# Patient Record
Sex: Female | Born: 1980 | Race: White | Hispanic: No | Marital: Single | Smoking: Never smoker
Health system: Southern US, Community
[De-identification: ages and names within clinical notes are randomized; demographics above are authoritative.]

## PROBLEM LIST (undated history)

## (undated) HISTORY — PX: INNER EAR SURGERY: SHX679

---

## 2016-01-14 ENCOUNTER — Inpatient Hospital Stay (HOSPITAL_COMMUNITY): Payer: BLUE CROSS/BLUE SHIELD

## 2016-01-14 ENCOUNTER — Inpatient Hospital Stay (HOSPITAL_COMMUNITY)
Admission: EM | Admit: 2016-01-14 | Discharge: 2016-01-21 | DRG: 235 | Disposition: A | Discharge status: Home / Self Care | Payer: BLUE CROSS/BLUE SHIELD | Source: Other Acute Inpatient Hospital | Attending: Thoracic Surgery (Cardiothoracic Vascular Surgery) | Admitting: Thoracic Surgery (Cardiothoracic Vascular Surgery)

## 2016-01-14 ENCOUNTER — Inpatient Hospital Stay (HOSPITAL_COMMUNITY): Payer: BLUE CROSS/BLUE SHIELD | Admitting: Certified Registered"

## 2016-01-14 ENCOUNTER — Emergency Department: Payer: BLUE CROSS/BLUE SHIELD

## 2016-01-14 ENCOUNTER — Emergency Department
Admission: EM | Admit: 2016-01-14 | Discharge: 2016-01-14 | Disposition: A | Discharge status: Nursing Home (Includes ICF) | Payer: BLUE CROSS/BLUE SHIELD | Attending: Emergency Medicine | Admitting: Emergency Medicine

## 2016-01-14 ENCOUNTER — Encounter (HOSPITAL_COMMUNITY)
Admission: EM | Disposition: A | Discharge status: Home / Self Care | Payer: BLUE CROSS/BLUE SHIELD | Source: Other Acute Inpatient Hospital | Attending: Thoracic Surgery (Cardiothoracic Vascular Surgery)

## 2016-01-14 ENCOUNTER — Encounter: Payer: Self-pay | Admitting: Emergency Medicine

## 2016-01-14 ENCOUNTER — Encounter
Admission: EM | Disposition: A | Discharge status: Nursing Home (Includes ICF) | Payer: Self-pay | Source: Home / Self Care | Attending: Emergency Medicine

## 2016-01-14 DIAGNOSIS — M79601 Pain in right arm: Secondary | ICD-10-CM | POA: Insufficient documentation

## 2016-01-14 DIAGNOSIS — J90 Pleural effusion, not elsewhere classified: Secondary | ICD-10-CM | POA: Diagnosis present

## 2016-01-14 DIAGNOSIS — I2542 Coronary artery dissection: Principal | ICD-10-CM | POA: Diagnosis present

## 2016-01-14 DIAGNOSIS — I25110 Atherosclerotic heart disease of native coronary artery with unstable angina pectoris: Secondary | ICD-10-CM

## 2016-01-14 DIAGNOSIS — I214 Non-ST elevation (NSTEMI) myocardial infarction: Secondary | ICD-10-CM

## 2016-01-14 DIAGNOSIS — E8770 Fluid overload, unspecified: Secondary | ICD-10-CM | POA: Diagnosis not present

## 2016-01-14 DIAGNOSIS — D696 Thrombocytopenia, unspecified: Secondary | ICD-10-CM | POA: Diagnosis not present

## 2016-01-14 DIAGNOSIS — R200 Anesthesia of skin: Secondary | ICD-10-CM | POA: Insufficient documentation

## 2016-01-14 DIAGNOSIS — Z3202 Encounter for pregnancy test, result negative: Secondary | ICD-10-CM | POA: Diagnosis not present

## 2016-01-14 DIAGNOSIS — J939 Pneumothorax, unspecified: Secondary | ICD-10-CM | POA: Diagnosis not present

## 2016-01-14 DIAGNOSIS — R079 Chest pain, unspecified: Secondary | ICD-10-CM | POA: Diagnosis present

## 2016-01-14 DIAGNOSIS — Z4682 Encounter for fitting and adjustment of non-vascular catheter: Secondary | ICD-10-CM

## 2016-01-14 DIAGNOSIS — Z951 Presence of aortocoronary bypass graft: Secondary | ICD-10-CM

## 2016-01-14 DIAGNOSIS — D62 Acute posthemorrhagic anemia: Secondary | ICD-10-CM | POA: Diagnosis not present

## 2016-01-14 DIAGNOSIS — I2109 ST elevation (STEMI) myocardial infarction involving other coronary artery of anterior wall: Secondary | ICD-10-CM | POA: Diagnosis present

## 2016-01-14 DIAGNOSIS — I2510 Atherosclerotic heart disease of native coronary artery without angina pectoris: Secondary | ICD-10-CM

## 2016-01-14 DIAGNOSIS — M79602 Pain in left arm: Secondary | ICD-10-CM | POA: Insufficient documentation

## 2016-01-14 HISTORY — PX: CARDIAC CATHETERIZATION: SHX172

## 2016-01-14 HISTORY — PX: CORONARY ARTERY BYPASS GRAFT: SHX141

## 2016-01-14 LAB — POCT I-STAT 3, ART BLOOD GAS (G3+)
ACID-BASE EXCESS: 3 mmol/L — AB (ref 0.0–2.0)
Bicarbonate: 27 mEq/L — ABNORMAL HIGH (ref 20.0–24.0)
O2 SAT: 100 %
PCO2 ART: 38.2 mmHg (ref 35.0–45.0)
PH ART: 7.455 — AB (ref 7.350–7.450)
TCO2: 28 mmol/L (ref 0–100)
pO2, Arterial: 480 mmHg — ABNORMAL HIGH (ref 80.0–100.0)

## 2016-01-14 LAB — POCT I-STAT, CHEM 8
BUN: 6 mg/dL (ref 6–20)
CALCIUM ION: 1.08 mmol/L — AB (ref 1.12–1.23)
Chloride: 104 mmol/L (ref 101–111)
Creatinine, Ser: 0.4 mg/dL — ABNORMAL LOW (ref 0.44–1.00)
Glucose, Bld: 116 mg/dL — ABNORMAL HIGH (ref 65–99)
HCT: 26 % — ABNORMAL LOW (ref 36.0–46.0)
HEMOGLOBIN: 8.8 g/dL — AB (ref 12.0–15.0)
Potassium: 3.5 mmol/L (ref 3.5–5.1)
SODIUM: 141 mmol/L (ref 135–145)
TCO2: 22 mmol/L (ref 0–100)

## 2016-01-14 LAB — CBC
HCT: 41.8 % (ref 35.0–47.0)
HCT: 27.1 % — ABNORMAL LOW (ref 36.0–46.0)
Hemoglobin: 13.6 g/dL (ref 12.0–16.0)
Hemoglobin: 9.1 g/dL — ABNORMAL LOW (ref 12.0–15.0)
MCH: 29.3 pg (ref 26.0–34.0)
MCH: 30.5 pg (ref 26.0–34.0)
MCHC: 32.5 g/dL (ref 32.0–36.0)
MCHC: 33.6 g/dL (ref 30.0–36.0)
MCV: 90.1 fL (ref 80.0–100.0)
MCV: 90.9 fL (ref 78.0–100.0)
PLATELETS: 166 10*3/uL (ref 150–440)
PLATELETS: 93 10*3/uL — AB (ref 150–400)
RBC: 4.64 MIL/uL (ref 3.80–5.20)
RBC: 2.98 MIL/uL — AB (ref 3.87–5.11)
RDW: 13.2 % (ref 11.5–14.5)
RDW: 13.1 % (ref 11.5–15.5)
WBC: 11.4 10*3/uL — AB (ref 3.6–11.0)
WBC: 18.5 10*3/uL — ABNORMAL HIGH (ref 4.0–10.5)

## 2016-01-14 LAB — HEMOGLOBIN AND HEMATOCRIT, BLOOD
HCT: 18.6 % — ABNORMAL LOW (ref 36.0–46.0)
Hemoglobin: 6.2 g/dL — CL (ref 12.0–15.0)

## 2016-01-14 LAB — BASIC METABOLIC PANEL
Anion gap: 6 (ref 5–15)
BUN: 14 mg/dL (ref 6–20)
CALCIUM: 9 mg/dL (ref 8.9–10.3)
CHLORIDE: 109 mmol/L (ref 101–111)
CO2: 24 mmol/L (ref 22–32)
CREATININE: 0.78 mg/dL (ref 0.44–1.00)
GFR calc non Af Amer: >60 mL/min (ref >60)
Glucose, Bld: 161 mg/dL — ABNORMAL HIGH (ref 65–99)
Potassium: 3.9 mmol/L (ref 3.5–5.1)
SODIUM: 139 mmol/L (ref 135–145)

## 2016-01-14 LAB — TROPONIN I
TROPONIN I: 1.13 ng/mL — AB (ref <0.031)
TROPONIN I: 8.03 ng/mL — AB (ref <0.031)

## 2016-01-14 LAB — PLATELET COUNT: Platelets: 112 10*3/uL — ABNORMAL LOW (ref 150–400)

## 2016-01-14 LAB — PROTIME-INR
INR: 1.8 — ABNORMAL HIGH (ref 0.00–1.49)
Prothrombin Time: 20.8 seconds — ABNORMAL HIGH (ref 11.6–15.2)

## 2016-01-14 LAB — ABO/RH: ABO/RH(D): O POS

## 2016-01-14 LAB — APTT: aPTT: 36 seconds (ref 24–37)

## 2016-01-14 SURGERY — LEFT HEART CATH AND CORONARY ANGIOGRAPHY
Anesthesia: Moderate Sedation

## 2016-01-14 SURGERY — CORONARY ARTERY BYPASS GRAFTING (CABG)
Anesthesia: General | Site: Chest | Laterality: Left

## 2016-01-14 MED ORDER — MORPHINE SULFATE (PF) 2 MG/ML IV SOLN
2.0000 mg | INTRAVENOUS | Status: DC | PRN
  Administered 2016-01-15: 2 mg via INTRAVENOUS
  Filled 2016-01-14: qty 1

## 2016-01-14 MED ORDER — SUCCINYLCHOLINE CHLORIDE 20 MG/ML IJ SOLN
INTRAMUSCULAR | Status: DC | PRN
  Administered 2016-01-14: 50 mg via INTRAVENOUS

## 2016-01-14 MED ORDER — PROTAMINE SULFATE 10 MG/ML IV SOLN
INTRAVENOUS | Status: DC | PRN
  Administered 2016-01-14: 22 mg via INTRAVENOUS

## 2016-01-14 MED ORDER — PANTOPRAZOLE SODIUM 40 MG PO TBEC
40.0000 mg | DELAYED_RELEASE_TABLET | Freq: Every day | ORAL | Status: DC
  Administered 2016-01-16 – 2016-01-21 (×6): 40 mg via ORAL
  Filled 2016-01-14 (×7): qty 1

## 2016-01-14 MED ORDER — HEPARIN (PORCINE) IN NACL 2-0.9 UNIT/ML-% IJ SOLN
INTRAMUSCULAR | Status: AC
  Filled 2016-01-14: qty 500

## 2016-01-14 MED ORDER — MORPHINE SULFATE (PF) 2 MG/ML IV SOLN
1.0000 mg | INTRAVENOUS | Status: AC | PRN
  Administered 2016-01-14 – 2016-01-15 (×3): 4 mg via INTRAVENOUS
  Administered 2016-01-15: 2 mg via INTRAVENOUS
  Filled 2016-01-14 (×3): qty 2
  Filled 2016-01-14: qty 1
  Filled 2016-01-14: qty 2

## 2016-01-14 MED ORDER — PHENYLEPHRINE HCL 10 MG/ML IJ SOLN
INTRAMUSCULAR | Status: DC | PRN
  Administered 2016-01-14: 120 ug via INTRAVENOUS

## 2016-01-14 MED ORDER — FENTANYL CITRATE (PF) 100 MCG/2ML IJ SOLN
INTRAMUSCULAR | Status: DC | PRN
  Administered 2016-01-14 (×2): 25 ug via INTRAVENOUS

## 2016-01-14 MED ORDER — SODIUM CHLORIDE 0.9 % IV SOLN
10.0000 g | INTRAVENOUS | Status: DC | PRN
  Administered 2016-01-14: 5 g/h via INTRAVENOUS

## 2016-01-14 MED ORDER — ACETAMINOPHEN 650 MG RE SUPP: 650.0000 mg | Freq: Once | RECTAL | Status: AC

## 2016-01-14 MED ORDER — DEXMEDETOMIDINE HCL IN NACL 400 MCG/100ML IV SOLN
0.1000 ug/kg/h | INTRAVENOUS | Status: DC
Start: 2016-01-14 — End: 2016-01-14
  Filled 2016-01-14: qty 100

## 2016-01-14 MED ORDER — HEMOSTATIC AGENTS (NO CHARGE) OPTIME
TOPICAL | Status: DC | PRN
  Administered 2016-01-14: 1 via TOPICAL

## 2016-01-14 MED ORDER — VERAPAMIL HCL 2.5 MG/ML IV SOLN
INTRAVENOUS | Status: AC
  Filled 2016-01-14: qty 2

## 2016-01-14 MED ORDER — SODIUM CHLORIDE 0.9 % IJ SOLN: 3.0000 mL | INTRAMUSCULAR | Status: DC | PRN

## 2016-01-14 MED ORDER — SUCCINYLCHOLINE CHLORIDE 20 MG/ML IJ SOLN
INTRAMUSCULAR | Status: AC
  Filled 2016-01-14: qty 1

## 2016-01-14 MED ORDER — BISACODYL 5 MG PO TBEC
10.0000 mg | DELAYED_RELEASE_TABLET | Freq: Every day | ORAL | Status: DC
  Administered 2016-01-15 – 2016-01-17 (×3): 10 mg via ORAL
  Filled 2016-01-14 (×3): qty 2

## 2016-01-14 MED ORDER — ATROPINE SULFATE 0.1 MG/ML IJ SOLN
INTRAMUSCULAR | Status: AC
  Filled 2016-01-14: qty 10

## 2016-01-14 MED ORDER — ACETAMINOPHEN 160 MG/5ML PO SOLN
650.0000 mg | Freq: Once | ORAL | Status: AC
  Administered 2016-01-15: 650 mg

## 2016-01-14 MED ORDER — FENTANYL CITRATE (PF) 250 MCG/5ML IJ SOLN
INTRAMUSCULAR | Status: AC
  Filled 2016-01-14: qty 25

## 2016-01-14 MED ORDER — NITROGLYCERIN 5 MG/ML IV SOLN
INTRAVENOUS | Status: AC
  Filled 2016-01-14: qty 10

## 2016-01-14 MED ORDER — HEPARIN SODIUM (PORCINE) 1000 UNIT/ML IJ SOLN
INTRAMUSCULAR | Status: DC | PRN
  Administered 2016-01-14: 3000 [IU] via INTRAVENOUS

## 2016-01-14 MED ORDER — LACTATED RINGERS IV SOLN: 500.0000 mL | Freq: Once | INTRAVENOUS | Status: DC | PRN

## 2016-01-14 MED ORDER — LEVOFLOXACIN IN D5W 500 MG/100ML IV SOLN
INTRAVENOUS | Status: DC | PRN
  Administered 2016-01-14: 500 mg via INTRAVENOUS

## 2016-01-14 MED ORDER — HEPARIN SODIUM (PORCINE) 1000 UNIT/ML IJ SOLN
INTRAMUSCULAR | Status: AC
  Filled 2016-01-14: qty 1

## 2016-01-14 MED ORDER — MIDAZOLAM HCL 2 MG/2ML IJ SOLN
2.0000 mg | INTRAMUSCULAR | Status: DC | PRN
Start: 2016-01-14 — End: 2016-01-17
  Administered 2016-01-15 (×2): 2 mg via INTRAVENOUS
  Filled 2016-01-14 (×3): qty 2

## 2016-01-14 MED ORDER — ASPIRIN EC 325 MG PO TBEC
325.0000 mg | DELAYED_RELEASE_TABLET | Freq: Every day | ORAL | Status: DC
  Administered 2016-01-15 – 2016-01-21 (×7): 325 mg via ORAL
  Filled 2016-01-14 (×7): qty 1

## 2016-01-14 MED ORDER — EPINEPHRINE HCL 1 MG/ML IJ SOLN
0.0000 ug/min | INTRAVENOUS | Status: DC
  Filled 2016-01-14 (×2): qty 4

## 2016-01-14 MED ORDER — DOPAMINE-DEXTROSE 3.2-5 MG/ML-% IV SOLN
0.0000 ug/kg/min | INTRAVENOUS | Status: DC
  Filled 2016-01-14: qty 250

## 2016-01-14 MED ORDER — LACTATED RINGERS IV SOLN
INTRAVENOUS | Status: DC
  Administered 2016-01-14: via INTRAVENOUS

## 2016-01-14 MED ORDER — SODIUM CHLORIDE 0.9 % IV SOLN: INTRAVENOUS | Status: DC

## 2016-01-14 MED ORDER — SODIUM CHLORIDE 0.9 % IV SOLN
0.5000 g/h | INTRAVENOUS | Status: DC
  Filled 2016-01-14: qty 20

## 2016-01-14 MED ORDER — PROPOFOL 10 MG/ML IV BOLUS
INTRAVENOUS | Status: AC
  Filled 2016-01-14: qty 20

## 2016-01-14 MED ORDER — TRAMADOL HCL 50 MG PO TABS
50.0000 mg | ORAL_TABLET | ORAL | Status: DC | PRN
  Administered 2016-01-15 – 2016-01-18 (×7): 100 mg via ORAL
  Administered 2016-01-19 – 2016-01-21 (×4): 50 mg via ORAL
  Filled 2016-01-14 (×5): qty 2
  Filled 2016-01-14 (×4): qty 1
  Filled 2016-01-14 (×3): qty 2

## 2016-01-14 MED ORDER — MIDAZOLAM HCL 10 MG/2ML IJ SOLN
INTRAMUSCULAR | Status: AC
  Filled 2016-01-14: qty 2

## 2016-01-14 MED ORDER — VERAPAMIL HCL 2.5 MG/ML IV SOLN
INTRAVENOUS | Status: DC | PRN
  Administered 2016-01-14: 2.5 mg via INTRA_ARTERIAL

## 2016-01-14 MED ORDER — MAGNESIUM SULFATE 4 GM/100ML IV SOLN
4.0000 g | Freq: Once | INTRAVENOUS | Status: AC
  Administered 2016-01-14: 4 g via INTRAVENOUS
  Filled 2016-01-14: qty 100

## 2016-01-14 MED ORDER — PROTAMINE SULFATE 10 MG/ML IV SOLN
INTRAVENOUS | Status: AC
  Filled 2016-01-14: qty 25

## 2016-01-14 MED ORDER — OXYCODONE HCL 5 MG PO TABS
5.0000 mg | ORAL_TABLET | ORAL | Status: DC | PRN
  Administered 2016-01-15 – 2016-01-17 (×5): 5 mg via ORAL
  Administered 2016-01-18 – 2016-01-19 (×3): 10 mg via ORAL
  Administered 2016-01-19 – 2016-01-20 (×3): 5 mg via ORAL
  Filled 2016-01-14 (×4): qty 1
  Filled 2016-01-14 (×3): qty 2
  Filled 2016-01-14 (×2): qty 1
  Filled 2016-01-14: qty 2
  Filled 2016-01-14: qty 1

## 2016-01-14 MED ORDER — FENTANYL CITRATE (PF) 100 MCG/2ML IJ SOLN
INTRAMUSCULAR | Status: DC | PRN
  Administered 2016-01-14: 250 ug via INTRAVENOUS
  Administered 2016-01-14: 100 ug via INTRAVENOUS
  Administered 2016-01-14: 250 ug via INTRAVENOUS
  Administered 2016-01-14: 100 ug via INTRAVENOUS
  Administered 2016-01-14: 50 ug via INTRAVENOUS
  Administered 2016-01-14: 250 ug via INTRAVENOUS

## 2016-01-14 MED ORDER — DEXMEDETOMIDINE HCL IN NACL 200 MCG/50ML IV SOLN
0.0000 ug/kg/h | INTRAVENOUS | Status: DC
  Filled 2016-01-14: qty 50

## 2016-01-14 MED ORDER — NITROGLYCERIN IN D5W 200-5 MCG/ML-% IV SOLN: 0.0000 ug/min | INTRAVENOUS | Status: DC

## 2016-01-14 MED ORDER — MIDAZOLAM HCL 2 MG/2ML IJ SOLN
INTRAMUSCULAR | Status: DC | PRN
  Administered 2016-01-14: 1 mg via INTRAVENOUS

## 2016-01-14 MED ORDER — PROPOFOL 10 MG/ML IV BOLUS
INTRAVENOUS | Status: DC | PRN
  Administered 2016-01-14: 60 mg via INTRAVENOUS

## 2016-01-14 MED ORDER — ACETAMINOPHEN 160 MG/5ML PO SOLN
1000.0000 mg | Freq: Four times a day (QID) | ORAL | Status: AC
  Administered 2016-01-15: 1000 mg

## 2016-01-14 MED ORDER — 0.9 % SODIUM CHLORIDE (POUR BTL) OPTIME
TOPICAL | Status: DC | PRN
  Administered 2016-01-14: 6000 mL

## 2016-01-14 MED ORDER — IOHEXOL 300 MG/ML  SOLN
INTRAMUSCULAR | Status: DC | PRN
  Administered 2016-01-14: 45 mL via INTRA_ARTERIAL

## 2016-01-14 MED ORDER — LEVOFLOXACIN IN D5W 750 MG/150ML IV SOLN
750.0000 mg | INTRAVENOUS | Status: AC
Start: 2016-01-15 — End: 2016-01-15
  Administered 2016-01-15: 750 mg via INTRAVENOUS
  Filled 2016-01-14: qty 150

## 2016-01-14 MED ORDER — LIDOCAINE HCL (CARDIAC) 20 MG/ML IV SOLN: INTRAVENOUS | Status: DC | PRN

## 2016-01-14 MED ORDER — NITROGLYCERIN 0.4 MG SL SUBL: 0.4000 mg | SUBLINGUAL_TABLET | SUBLINGUAL | Status: DC | PRN

## 2016-01-14 MED ORDER — SODIUM CHLORIDE 0.9 % IJ SOLN
OROMUCOSAL | Status: DC | PRN
  Administered 2016-01-14: 4 mL via TOPICAL

## 2016-01-14 MED ORDER — INSULIN REGULAR BOLUS VIA INFUSION
0.0000 [IU] | Freq: Three times a day (TID) | INTRAVENOUS | Status: DC
  Filled 2016-01-14: qty 10

## 2016-01-14 MED ORDER — ALBUMIN HUMAN 5 % IV SOLN
INTRAVENOUS | Status: DC | PRN
  Administered 2016-01-14: 21:00:00 via INTRAVENOUS

## 2016-01-14 MED ORDER — FENTANYL CITRATE (PF) 100 MCG/2ML IJ SOLN
INTRAMUSCULAR | Status: AC
  Filled 2016-01-14: qty 2

## 2016-01-14 MED ORDER — METOPROLOL TARTRATE 25 MG/10 ML ORAL SUSPENSION: 12.5000 mg | Freq: Two times a day (BID) | ORAL | Status: DC

## 2016-01-14 MED ORDER — MIDAZOLAM HCL 2 MG/2ML IJ SOLN
INTRAMUSCULAR | Status: AC
  Administered 2016-01-14: 2 mg
  Filled 2016-01-14: qty 2

## 2016-01-14 MED ORDER — METOPROLOL TARTRATE 12.5 MG HALF TABLET: 12.5000 mg | ORAL_TABLET | Freq: Two times a day (BID) | ORAL | Status: DC

## 2016-01-14 MED ORDER — HEPARIN SODIUM (PORCINE) 1000 UNIT/ML IJ SOLN
INTRAMUSCULAR | Status: DC | PRN
  Administered 2016-01-14: 12000 [IU] via INTRAVENOUS
  Administered 2016-01-14: 10000 [IU] via INTRAVENOUS

## 2016-01-14 MED ORDER — ONDANSETRON HCL 4 MG/2ML IJ SOLN
4.0000 mg | Freq: Four times a day (QID) | INTRAMUSCULAR | Status: DC | PRN
  Administered 2016-01-15 – 2016-01-16 (×3): 4 mg via INTRAVENOUS
  Filled 2016-01-14 (×3): qty 2

## 2016-01-14 MED ORDER — SODIUM CHLORIDE 0.9 % IV SOLN: 250.0000 mL | INTRAVENOUS | Status: DC

## 2016-01-14 MED ORDER — SODIUM CHLORIDE 0.9 % IV SOLN
INTRAVENOUS | Status: DC
  Filled 2016-01-14 (×2): qty 2.5

## 2016-01-14 MED ORDER — SODIUM CHLORIDE 0.9 % IV SOLN
INTRAVENOUS | Status: DC
  Filled 2016-01-14 (×2): qty 40

## 2016-01-14 MED ORDER — PHENYLEPHRINE HCL 10 MG/ML IJ SOLN
30.0000 ug/min | INTRAVENOUS | Status: DC
  Filled 2016-01-14 (×2): qty 2

## 2016-01-14 MED ORDER — ASPIRIN 81 MG PO CHEW: 324.0000 mg | CHEWABLE_TABLET | Freq: Every day | ORAL | Status: DC

## 2016-01-14 MED ORDER — ROCURONIUM BROMIDE 100 MG/10ML IV SOLN
INTRAVENOUS | Status: DC | PRN
  Administered 2016-01-14 (×3): 50 mg via INTRAVENOUS

## 2016-01-14 MED ORDER — VANCOMYCIN HCL 1000 MG IV SOLR
1250.0000 mg | INTRAVENOUS | Status: DC | PRN
  Administered 2016-01-14: 1250 mg via INTRAVENOUS

## 2016-01-14 MED ORDER — LACTATED RINGERS IV SOLN
INTRAVENOUS | Status: DC | PRN
  Administered 2016-01-14 (×2): via INTRAVENOUS

## 2016-01-14 MED ORDER — PLASMA-LYTE 148 IV SOLN
INTRAVENOUS | Status: DC | PRN
  Administered 2016-01-14: 500 mL via INTRAVASCULAR

## 2016-01-14 MED ORDER — PHENYLEPHRINE HCL 10 MG/ML IJ SOLN
0.0000 ug/min | INTRAMUSCULAR | Status: DC
  Administered 2016-01-15: 20 ug/min via INTRAVENOUS
  Filled 2016-01-14 (×2): qty 2

## 2016-01-14 MED ORDER — ATROPINE SULFATE 0.1 MG/ML IJ SOLN
INTRAMUSCULAR | Status: DC | PRN
  Administered 2016-01-14: 0.5 mg via INTRAVENOUS

## 2016-01-14 MED ORDER — SODIUM CHLORIDE 0.9 % IV SOLN
INTRAVENOUS | Status: DC
  Filled 2016-01-14 (×2): qty 30

## 2016-01-14 MED ORDER — VANCOMYCIN HCL IN DEXTROSE 1-5 GM/200ML-% IV SOLN
1000.0000 mg | Freq: Once | INTRAVENOUS | Status: AC
  Administered 2016-01-15: 1000 mg via INTRAVENOUS
  Filled 2016-01-14: qty 200

## 2016-01-14 MED ORDER — ASPIRIN 81 MG PO CHEW
324.0000 mg | CHEWABLE_TABLET | Freq: Once | ORAL | Status: AC
  Administered 2016-01-14: 324 mg via ORAL
  Filled 2016-01-14: qty 4

## 2016-01-14 MED ORDER — IOHEXOL 350 MG/ML SOLN
75.0000 mL | Freq: Once | INTRAVENOUS | Status: AC | PRN
  Administered 2016-01-14: 75 mL via INTRAVENOUS

## 2016-01-14 MED ORDER — POTASSIUM CHLORIDE 10 MEQ/50ML IV SOLN
10.0000 meq | INTRAVENOUS | Status: AC
  Administered 2016-01-14 – 2016-01-15 (×3): 10 meq via INTRAVENOUS

## 2016-01-14 MED ORDER — SODIUM CHLORIDE 0.9 % IJ SOLN
3.0000 mL | Freq: Two times a day (BID) | INTRAMUSCULAR | Status: DC
  Administered 2016-01-15: 3 mL via INTRAVENOUS

## 2016-01-14 MED ORDER — ROCURONIUM BROMIDE 50 MG/5ML IV SOLN
INTRAVENOUS | Status: AC
  Filled 2016-01-14: qty 1

## 2016-01-14 MED ORDER — SODIUM CHLORIDE 0.45 % IV SOLN: INTRAVENOUS | Status: DC | PRN

## 2016-01-14 MED ORDER — MIDAZOLAM HCL 5 MG/5ML IJ SOLN
INTRAMUSCULAR | Status: DC | PRN
  Administered 2016-01-14 (×2): 5 mg via INTRAVENOUS

## 2016-01-14 MED ORDER — NITROGLYCERIN IN D5W 200-5 MCG/ML-% IV SOLN
2.0000 ug/min | INTRAVENOUS | Status: DC
  Filled 2016-01-14: qty 250

## 2016-01-14 MED ORDER — LIDOCAINE HCL (CARDIAC) 20 MG/ML IV SOLN
INTRAVENOUS | Status: AC
  Filled 2016-01-14: qty 5

## 2016-01-14 MED ORDER — POTASSIUM CHLORIDE 2 MEQ/ML IV SOLN
80.0000 meq | INTRAVENOUS | Status: DC
  Filled 2016-01-14 (×2): qty 40

## 2016-01-14 MED ORDER — LEVOFLOXACIN IN D5W 500 MG/100ML IV SOLN
500.0000 mg | INTRAVENOUS | Status: DC
  Filled 2016-01-14 (×2): qty 100

## 2016-01-14 MED ORDER — PAPAVERINE HCL 30 MG/ML IJ SOLN
INTRAMUSCULAR | Status: DC
  Filled 2016-01-14 (×2): qty 2.5

## 2016-01-14 MED ORDER — SODIUM CHLORIDE 0.9 % IV SOLN
200.0000 ug | INTRAVENOUS | Status: DC | PRN
  Administered 2016-01-14: .7 ug/kg/h via INTRAVENOUS

## 2016-01-14 MED ORDER — MAGNESIUM SULFATE 50 % IJ SOLN
40.0000 meq | INTRAMUSCULAR | Status: DC
  Filled 2016-01-14 (×2): qty 10

## 2016-01-14 MED ORDER — ACETAMINOPHEN 500 MG PO TABS
1000.0000 mg | ORAL_TABLET | Freq: Four times a day (QID) | ORAL | Status: AC
  Administered 2016-01-15 – 2016-01-19 (×12): 1000 mg via ORAL
  Filled 2016-01-14 (×11): qty 2

## 2016-01-14 MED ORDER — LACTATED RINGERS IV SOLN
INTRAVENOUS | Status: DC
  Administered 2016-01-14 – 2016-01-15 (×2): via INTRAVENOUS

## 2016-01-14 MED ORDER — MIDAZOLAM HCL 2 MG/2ML IJ SOLN
INTRAMUSCULAR | Status: AC
  Filled 2016-01-14: qty 2

## 2016-01-14 MED ORDER — SIMVASTATIN 20 MG PO TABS
20.0000 mg | ORAL_TABLET | Freq: Every day | ORAL | Status: DC
  Administered 2016-01-15 – 2016-01-17 (×3): 20 mg via ORAL
  Filled 2016-01-14 (×3): qty 1

## 2016-01-14 MED ORDER — ONDANSETRON HCL 4 MG/2ML IJ SOLN
INTRAMUSCULAR | Status: AC
  Filled 2016-01-14: qty 2

## 2016-01-14 MED ORDER — CHLORHEXIDINE GLUCONATE 0.12 % MT SOLN
15.0000 mL | OROMUCOSAL | Status: AC
  Administered 2016-01-15: 15 mL via OROMUCOSAL

## 2016-01-14 MED ORDER — VANCOMYCIN HCL 10 G IV SOLR
1250.0000 mg | INTRAVENOUS | Status: DC
  Filled 2016-01-14 (×2): qty 1250

## 2016-01-14 MED ORDER — BISACODYL 10 MG RE SUPP: 10.0000 mg | Freq: Every day | RECTAL | Status: DC

## 2016-01-14 MED ORDER — PHENYLEPHRINE HCL 10 MG/ML IJ SOLN
10.0000 mg | INTRAVENOUS | Status: DC | PRN
  Administered 2016-01-14: 15 ug/min via INTRAVENOUS

## 2016-01-14 MED ORDER — SODIUM CHLORIDE 0.9 % IV SOLN
INTRAVENOUS | Status: DC | PRN
  Administered 2016-01-14: 22:00:00 via INTRAVENOUS

## 2016-01-14 MED ORDER — FAMOTIDINE IN NACL 20-0.9 MG/50ML-% IV SOLN
20.0000 mg | Freq: Two times a day (BID) | INTRAVENOUS | Status: AC
  Administered 2016-01-14 – 2016-01-15 (×2): 20 mg via INTRAVENOUS
  Filled 2016-01-14: qty 50

## 2016-01-14 MED ORDER — DOCUSATE SODIUM 100 MG PO CAPS
200.0000 mg | ORAL_CAPSULE | Freq: Every day | ORAL | Status: DC
  Administered 2016-01-15 – 2016-01-21 (×5): 200 mg via ORAL
  Filled 2016-01-14 (×6): qty 2

## 2016-01-14 MED ORDER — METOPROLOL TARTRATE 1 MG/ML IV SOLN: 2.5000 mg | INTRAVENOUS | Status: DC | PRN

## 2016-01-14 MED ORDER — ALBUMIN HUMAN 5 % IV SOLN
250.0000 mL | INTRAVENOUS | Status: AC | PRN
  Administered 2016-01-14 – 2016-01-15 (×4): 250 mL via INTRAVENOUS
  Filled 2016-01-14: qty 250

## 2016-01-14 MED ORDER — INSULIN REGULAR HUMAN 100 UNIT/ML IJ SOLN
250.0000 [IU] | INTRAMUSCULAR | Status: DC | PRN
  Administered 2016-01-14: 3 [IU]/h via INTRAVENOUS

## 2016-01-14 SURGICAL SUPPLY — 15 items
CABLE ADAPT CONN TEMP 6FT (ADAPTER) IMPLANT
CATH IAB 7FR 40ML (CATHETERS) ×3 IMPLANT
CATH OPTITORQUE JACKY 4.0 5F (CATHETERS) ×3 IMPLANT
DEVICE INFLAT 30 PLUS (MISCELLANEOUS) IMPLANT
DEVICE RAD TR BAND REGULAR (VASCULAR PRODUCTS) ×3 IMPLANT
DEVICE SAFEGUARD 24CM (GAUZE/BANDAGES/DRESSINGS) IMPLANT
DEVICE SECURE STATLOCK IABP (MISCELLANEOUS) ×6 IMPLANT
GLIDESHEATH SLEND SS 6F .021 (SHEATH) ×3 IMPLANT
KIT MANI 3VAL PERCEP (MISCELLANEOUS) ×3 IMPLANT
PACK CARDIAC CATH (CUSTOM PROCEDURE TRAY) ×3 IMPLANT
SHEATH PINNACLE 7F 10CM (SHEATH) ×3 IMPLANT
SLEEVE REPOSITIONING LENGTH 30 (MISCELLANEOUS) IMPLANT
WIRE HITORQ VERSACORE ST 145CM (WIRE) ×3 IMPLANT
WIRE PACING TEMP ST TIP 5 (CATHETERS) IMPLANT
WIRE SAFE-T 1.5MM-J .035X260CM (WIRE) IMPLANT

## 2016-01-14 SURGICAL SUPPLY — 82 items
BAG DECANTER FOR FLEXI CONT (MISCELLANEOUS) ×3 IMPLANT
BANDAGE ACE 4X5 VEL STRL LF (GAUZE/BANDAGES/DRESSINGS) ×3 IMPLANT
BANDAGE ACE 6X5 VEL STRL LF (GAUZE/BANDAGES/DRESSINGS) ×3 IMPLANT
BANDAGE ELASTIC 4 VELCRO ST LF (GAUZE/BANDAGES/DRESSINGS) ×3 IMPLANT
BANDAGE ELASTIC 6 VELCRO ST LF (GAUZE/BANDAGES/DRESSINGS) ×3 IMPLANT
BASKET HEART  (ORDER IN 25'S) (MISCELLANEOUS) ×1
BASKET HEART (ORDER IN 25'S) (MISCELLANEOUS) ×1
BASKET HEART (ORDER IN 25S) (MISCELLANEOUS) ×1 IMPLANT
BLADE STERNUM SYSTEM 6 (BLADE) ×3 IMPLANT
BNDG GAUZE ELAST 4 BULKY (GAUZE/BANDAGES/DRESSINGS) ×3 IMPLANT
CANISTER SUCTION 2500CC (MISCELLANEOUS) ×3 IMPLANT
CANNULA EZ GLIDE AORTIC 21FR (CANNULA) ×3 IMPLANT
CATH CPB KIT HENDRICKSON (MISCELLANEOUS) ×3 IMPLANT
CATH ROBINSON RED A/P 18FR (CATHETERS) ×3 IMPLANT
CATH THORACIC 36FR (CATHETERS) ×3 IMPLANT
CATH THORACIC 36FR RT ANG (CATHETERS) ×3 IMPLANT
CLIP TI MEDIUM 24 (CLIP) IMPLANT
CLIP TI WIDE RED SMALL 24 (CLIP) ×6 IMPLANT
CRADLE DONUT ADULT HEAD (MISCELLANEOUS) ×3 IMPLANT
DRAPE CARDIOVASCULAR INCISE (DRAPES) ×2
DRAPE SLUSH/WARMER DISC (DRAPES) ×3 IMPLANT
DRAPE SRG 135X102X78XABS (DRAPES) ×1 IMPLANT
DRSG COVADERM 4X14 (GAUZE/BANDAGES/DRESSINGS) ×3 IMPLANT
ELECT REM PT RETURN 9FT ADLT (ELECTROSURGICAL) ×6
ELECTRODE REM PT RTRN 9FT ADLT (ELECTROSURGICAL) ×2 IMPLANT
GAUZE SPONGE 4X4 12PLY STRL (GAUZE/BANDAGES/DRESSINGS) ×6 IMPLANT
GLOVE SURG SIGNA 7.5 PF LTX (GLOVE) ×9 IMPLANT
GOWN STRL REUS W/ TWL LRG LVL3 (GOWN DISPOSABLE) ×4 IMPLANT
GOWN STRL REUS W/ TWL XL LVL3 (GOWN DISPOSABLE) ×2 IMPLANT
GOWN STRL REUS W/TWL LRG LVL3 (GOWN DISPOSABLE) ×8
GOWN STRL REUS W/TWL XL LVL3 (GOWN DISPOSABLE) ×4
HEMOSTAT POWDER SURGIFOAM 1G (HEMOSTASIS) ×9 IMPLANT
HEMOSTAT SURGICEL 2X14 (HEMOSTASIS) ×3 IMPLANT
INSERT FOGARTY XLG (MISCELLANEOUS) IMPLANT
KIT BASIN OR (CUSTOM PROCEDURE TRAY) ×3 IMPLANT
KIT CATH SUCT 8FR (CATHETERS) ×3 IMPLANT
KIT ROOM TURNOVER OR (KITS) ×3 IMPLANT
KIT SUCTION CATH 14FR (SUCTIONS) ×6 IMPLANT
KIT VASOVIEW W/TROCAR VH 2000 (KITS) ×3 IMPLANT
MARKER GRAFT CORONARY BYPASS (MISCELLANEOUS) ×9 IMPLANT
NS IRRIG 1000ML POUR BTL (IV SOLUTION) ×15 IMPLANT
PACK OPEN HEART (CUSTOM PROCEDURE TRAY) ×3 IMPLANT
PAD ARMBOARD 7.5X6 YLW CONV (MISCELLANEOUS) ×6 IMPLANT
PAD ELECT DEFIB RADIOL ZOLL (MISCELLANEOUS) ×3 IMPLANT
PENCIL BUTTON HOLSTER BLD 10FT (ELECTRODE) ×3 IMPLANT
PUNCH AORTIC ROTATE  4.5MM 8IN (MISCELLANEOUS) ×3 IMPLANT
PUNCH AORTIC ROTATE 4.0MM (MISCELLANEOUS) IMPLANT
PUNCH AORTIC ROTATE 4.5MM 8IN (MISCELLANEOUS) IMPLANT
PUNCH AORTIC ROTATE 5MM 8IN (MISCELLANEOUS) IMPLANT
SET CARDIOPLEGIA MPS 5001102 (MISCELLANEOUS) ×3 IMPLANT
SPONGE GAUZE 4X4 12PLY STER LF (GAUZE/BANDAGES/DRESSINGS) ×6 IMPLANT
SUT BONE WAX W31G (SUTURE) ×3 IMPLANT
SUT MNCRL AB 4-0 PS2 18 (SUTURE) IMPLANT
SUT PROLENE 3 0 SH DA (SUTURE) ×3 IMPLANT
SUT PROLENE 4 0 RB 1 (SUTURE) ×2
SUT PROLENE 4 0 SH DA (SUTURE) IMPLANT
SUT PROLENE 4-0 RB1 .5 CRCL 36 (SUTURE) ×1 IMPLANT
SUT PROLENE 6 0 C 1 30 (SUTURE) ×6 IMPLANT
SUT PROLENE 7 0 BV1 MDA (SUTURE) ×3 IMPLANT
SUT PROLENE 8 0 BV175 6 (SUTURE) IMPLANT
SUT STEEL 6MS V (SUTURE) ×6 IMPLANT
SUT STEEL STERNAL CCS#1 18IN (SUTURE) IMPLANT
SUT STEEL SZ 6 DBL 3X14 BALL (SUTURE) ×3 IMPLANT
SUT VIC AB 1 CTX 36 (SUTURE) ×4
SUT VIC AB 1 CTX36XBRD ANBCTR (SUTURE) ×2 IMPLANT
SUT VIC AB 2-0 CT1 27 (SUTURE) ×2
SUT VIC AB 2-0 CT1 TAPERPNT 27 (SUTURE) ×1 IMPLANT
SUT VIC AB 2-0 CTX 27 (SUTURE) IMPLANT
SUT VIC AB 3-0 SH 27 (SUTURE)
SUT VIC AB 3-0 SH 27X BRD (SUTURE) IMPLANT
SUT VIC AB 3-0 X1 27 (SUTURE) ×3 IMPLANT
SUT VICRYL 4-0 PS2 18IN ABS (SUTURE) IMPLANT
SUTURE E-PAK OPEN HEART (SUTURE) ×3 IMPLANT
SYSTEM SAHARA CHEST DRAIN ATS (WOUND CARE) ×3 IMPLANT
TAPE CLOTH SURG 4X10 WHT LF (GAUZE/BANDAGES/DRESSINGS) ×3 IMPLANT
TOWEL OR 17X24 6PK STRL BLUE (TOWEL DISPOSABLE) ×6 IMPLANT
TOWEL OR 17X26 10 PK STRL BLUE (TOWEL DISPOSABLE) ×6 IMPLANT
TRAY FOLEY IC TEMP SENS 16FR (CATHETERS) ×3 IMPLANT
TUBE FEEDING 8FR 16IN STR KANG (MISCELLANEOUS) ×3 IMPLANT
TUBING INSUFFLATION (TUBING) ×3 IMPLANT
UNDERPAD 30X30 INCONTINENT (UNDERPADS AND DIAPERS) ×3 IMPLANT
WATER STERILE IRR 1000ML POUR (IV SOLUTION) ×6 IMPLANT

## 2016-01-14 NOTE — ED Notes (Signed)
Pt to xray

## 2016-01-14 NOTE — ED Notes (Addendum)
Pt c/o mid/upper back pain with acute onset this morning like someone had a knee her back.  Pain is also in central chest.  Pain constant in nature.  No medical problems.  Has been fatigued. Pain also to both arms, mostly elbows; numbness as well in arms. Denies SHOB.

## 2016-01-14 NOTE — Brief Op Note (Addendum)
01/14/2016  8:13 PM  PATIENT:  Harlene Ramus  35 y.o. female  PRE-OPERATIVE DIAGNOSIS: 1. 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   Delay start of Pharmacological VTE agent (>24hrs) due to surgical blood loss or risk of bleeding: yes  BASELINE WEIGHT: 54 kg

## 2016-01-14 NOTE — ED Provider Notes (Addendum)
[facility] Emergency Department Provider Note  ____________________________________________  Time seen: Approximately 1055 AM  I have reviewed the triage vital signs and the nursing notes.   HISTORY  Chief Complaint Chest Pain    HPI Cody Oliger is a 35 y.o. female without any chronic medical conditions who is presenting today with central pressure-like chest pain which started suddenly at about 9:30 this morning while she was in the shower. She says the pain radiates to her back between her shoulder blades. She denies any shortness of breath, nausea vomiting or diaphoresis. She says that the pain is not worsened with deep breathing. It is not worsened with motion and she has not done any heavy lifting or exertion lately. Denies any history of cardiac disease in her family. Denies any smoking drinking or drug use.Says that she also has numbness and aching pain in both of her arms as well.     History reviewed. No pertinent past medical history.  There are no active problems to display for this patient.   Past Surgical History  Procedure Laterality Date  . Inner ear surgery      No current outpatient prescriptions on file.  Allergies Ceclor  History reviewed. No pertinent family history.  Social History Social History  Substance Use Topics  . Smoking status: Never Smoker   . Smokeless tobacco: None  . Alcohol Use: No    Review of Systems Constitutional: No fever/chills Eyes: No visual changes. ENT: No sore throat. Cardiovascular: As above Respiratory: Denies shortness of breath. Gastrointestinal: No abdominal pain.  No nausea, no vomiting.  No diarrhea.  No constipation. Genitourinary: Negative for dysuria. Musculoskeletal: Negative for back pain. Skin: Negative for rash. Neurological: Negative for headaches, focal weakness or numbness.  10-point ROS otherwise negative.  ____________________________________________   PHYSICAL  EXAM:  VITAL SIGNS: ED Triage Vitals  Enc Vitals Group     BP 01/14/16 1028 119/89 mmHg     Pulse Rate 01/14/16 1028 62     Resp 01/14/16 1028 16     Temp 01/14/16 1028 97.6 F (36.4 C)     Temp Source 01/14/16 1028 Oral     SpO2 01/14/16 1028 100 %     Weight 01/14/16 1026 120 lb (54.432 kg)     Height --      Head Cir --      Peak Flow --      Pain Score 01/14/16 1024 7     Pain Loc --      Pain Edu? --      Excl. in GC? --     Constitutional: Alert and oriented. Well appearing and in no acute distress. Eyes: Conjunctivae are normal. PERRL. EOMI. Head: Atraumatic. Nose: No congestion/rhinnorhea. Mouth/Throat: Mucous membranes are moist.   Neck: No stridor.   Cardiovascular: Normal rate, regular rhythm. Grossly normal heart sounds.  Good peripheral circulation. Equal and bilateral pulses at the radial as well as dorsalis pedis pulse. Chest pain is not reproducible to palpation. Respiratory: Normal respiratory effort.  No retractions. Lungs CTAB. Gastrointestinal: Soft and nontender. No distention. No abdominal bruits. No CVA tenderness. Musculoskeletal: No lower extremity tenderness nor edema.  No joint effusions. Neurologic:  Normal speech and language. No gross focal neurologic deficits are appreciated. Sensation is intact to light touch. No gait instability. Skin:  Skin is warm, dry and intact. No rash noted. Psychiatric: Mood and affect are normal. Speech and behavior are normal.  ____________________________________________   LABS (all labs ordered are  listed, but only abnormal results are displayed)  Labs Reviewed  CBC - Abnormal; Notable for the following:    WBC 11.4 (*)    All other components within normal limits  TROPONIN I - Abnormal; Notable for the following:    Troponin I 1.13 (*)    All other components within normal limits  BASIC METABOLIC PANEL - Abnormal; Notable for the following:    Glucose, Bld 161 (*)    All other components within normal  limits  TROPONIN I - Abnormal; Notable for the following:    Troponin I 8.03 (*)    All other components within normal limits  POC URINE PREG, ED   ____________________________________________  EKG  ED ECG REPORT I, Arelia Longest, the attending physician, personally viewed and interpreted this ECG.   Date: 01/14/2016  EKG Time: 1027  Rate: 63  Rhythm: normal sinus rhythm  Axis: Normal axis  Intervals:none  ST&T Change: ST elevations in V3 through 6. There also appears to be mild elevation in lead 2. Biphasic T-wave in aVL. Elevations are concave and did not appear to be of the morphology of a STEMI.  ED ECG REPORT I, Arelia Longest, the attending physician, personally viewed and interpreted this ECG.   Date: 01/14/2016  EKG Time: 1055  Rate: 63  Rhythm: normal sinus rhythm  Axis: Normal axis  Intervals:none  ST&T Change: ST elevation in 2, 3, aVF as well as V4 through V6. T-wave inversion in aVL. Similar morphology to previous EKG of the ST elevations.  Possible pericarditis.  ED ECG REPORT I, Arelia Longest, the attending physician, personally viewed and interpreted this ECG.   Date: 01/14/2016  EKG Time: 2 PM  Rate: 80  Rhythm: normal sinus rhythm  Axis: Normal axis  Intervals:Short PR interval  ST&T Change: Continues to have anterolateral ST elevation without any change from previous.   ____________________________________________  RADIOLOGY  No acute disease on the chest x-ray.  Normal CTA of the chest. ____________________________________________   PROCEDURES  CRITICAL CARE Performed by: Arelia Longest   Total critical care time: 35 minutes  Critical care time was exclusive of separately billable procedures and treating other patients.  Critical care was necessary to treat or prevent imminent or life-threatening deterioration.  Critical care was time spent personally by me on the following activities: development of treatment  plan with patient and/or surrogate as well as nursing, discussions with consultants, evaluation of patient's response to treatment, examination of patient, obtaining history from patient or surrogate, ordering and performing treatments and interventions, ordering and review of laboratory studies, ordering and review of radiographic studies, pulse oximetry and re-evaluation of patient's condition.   ____________________________________________   INITIAL IMPRESSION / ASSESSMENT AND PLAN / ED COURSE  Pertinent labs & imaging results that were available during my care of the patient were reviewed by me and considered in my medical decision making (see chart for details).  PERC negative. patient not on any hormone supplements of birth control. Says she is only taking vitamins at home.  ----------------------------------------- 1:01 PM on 01/14/2016 -----------------------------------------  Patient says that her pain is now a 3 out of 10. Resting comfortably at this time. Patient is denying any recent viral illness. Re-auscultated heart sounds and no definitive pericardial friction rub.  ----------------------------------------- 1:40 PM on 01/14/2016 -----------------------------------------  Patient troponin returned back at 1.13. I immediately called Dr. Kirke Corin who is on; he agrees that the initial as well as repeat EKGs do  not appear to meet STEMI criteria. He agrees more with a diagnosis of pericarditis. Especially because the patient has no risk factors. After talking to Dr.Arida I reassessed the patient as she is resting comfortably. She is denying any pain at this time. We will Proceed with a CT angiography to workup for an aortic dissection.  ----------------------------------------- 320 PM on 01/14/2016 ----------------------------------------- I had called back Dr. Kirke Corin after reviewing the CT angiogram myself.  I also told him about the increased second troponin. He says that he will  be down to see the patient in the emergency department immediately. Patient reassessed at this time and she is resting comfortably without any complaints or pain.  ----------------------------------------- 3:56 PM on 01/14/2016 -----------------------------------------  Patient taken to catheter lab with Dr. Kirke Corin.  I reviewed with him all of the EKGs face-to-face and he still is saying that he believes the EKGs did not show any obvious STEMI. However, given the patient's increasing troponin she'll be taken to the cardiac catheterization lab. Heparin was held because of the suspicion for dissection during the patient's ED course. ____________________________________________   FINAL CLINICAL IMPRESSION(S) / ED DIAGNOSES  Chest pain.      Myrna Blazer, MD 01/14/16 1610  Myrna Blazer, MD 01/14/16 [phone]

## 2016-01-14 NOTE — ED Notes (Signed)
POCT urine pregnancy negative 

## 2016-01-14 NOTE — Consult Note (Signed)
CARDIOLOGY CONSULT NOTE  Patient ID: Karina Flynn MRN: [id number] DOB/AGE: 31-May-1981 34 y.o.  Admit date: 01/14/2016 Referring Physician : Dr. Snyder Lions Primary Cardiologist : New Reason for Consultation : Chest pain.  HPI:  This is a 35 year old Caucasian female with no previous cardiac history. She has no chronic medical conditions and she does not take any medications. She is not a smoker and has no family history of coronary artery disease. She has been sexually inactive and reports that there is no possibility of her being pregnant. She presented with sudden onset of chest pain this morning. She is visiting town from [place]. The chest pain was sudden after she took a shower and it was substernal with radiating to her back. It was associated with significant fatigue, shortness of breath and dizziness. There was no syncope. She came to the emergency room where she was found to have mildly elevated troponin. EKG showed minor global ST elevation. The pattern was suggestive of pericarditis. The patient however denied any symptoms of upper respiratory tract infection. We decided to do CTA of the chest which showed no evidence of aortic dissection. The second troponin came back elevated at 8 and thus emergent cardiac catheterization was recommended.   A 10 point review of system was performed. It is negative other than that mentioned in the history of present illness.   History reviewed. No pertinent past medical history.  History reviewed.  Family history: No family history of coronary artery disease or aortic dissection.  Social History   Social History  . Marital Status: Single    Spouse Name: N/A  . Number of Children: N/A  . Years of Education: N/A   Occupational History  . Not on file.   Social History Main Topics  . Smoking status: Never Smoker   . Smokeless tobacco: Not on file  . Alcohol Use: No  . Drug Use: No  . Sexual Activity: Not on file    Other Topics Concern  . Not on file   Social History Narrative  . No narrative on file    Past Surgical History  Procedure Laterality Date  . Inner ear surgery       No prescriptions prior to admission    Physical Exam: Blood pressure 117/78, pulse 89, temperature 97.6 F (36.4 C), temperature source Oral, resp. rate 22, weight 120 lb (54.432 kg), last menstrual period 12/10/2015, SpO2 100 %.  Constitutional:  oriented to person, place, and time. He appears well-developed and well-nourished. No distress.  HENT: No nasal discharge.  Head: Normocephalic and atraumatic.  Eyes: Pupils are equal and round.  No discharge. Neck: Normal range of motion. Neck supple. No JVD present. No thyromegaly present.  Cardiovascular: Normal rate, regular rhythm, normal heart sounds. Exam reveals no gallop and no friction rub. No murmur heard.  Pulmonary/Chest: Effort normal and breath sounds normal. No stridor. No respiratory distress.  no wheezes or rales.   Abdominal: Soft. Bowel sounds are normal. He exhibits no distension. There is no tenderness. There is no rebound and no guarding.  Musculoskeletal: Normal range of motion. No edema and no tenderness.  Neurological: Alert and oriented to person, place, and time. Coordination normal.  Skin: Skin is warm and dry. No rash noted. He is not diaphoretic. No erythema. No pallor.  Psychiatric: Normal mood and affect.  behavior is normal. Judgment and thought content normal.     Labs:   Lab Results  Component Value Date   WBC 11.4* 01/14/2016  HGB 13.6 01/14/2016   HCT 41.8 01/14/2016   MCV 90.1 01/14/2016   PLT 166 01/14/2016    Recent Labs Lab 01/14/16 1221  NA 139  K 3.9  CL 109  CO2 24  BUN 14  CREATININE 0.78  CALCIUM 9.0  GLUCOSE 161*   Lab Results  Component Value Date   TROPONINI 8.03* 01/14/2016      EKG: Personally reviewed by me and showed  sinus tachycardia with minor anterior and inferior ST elevation which did not  meet criteria for ST elevation myocardial infarction.  ASSESSMENT AND PLAN:    1. Non-ST elevation myocardial infarction in a young patient with no risk factors for coronary artery disease. She is also not a smoker. Thus, I proceeded with emergent cardiac catheterization which showed evidence of spontaneous coronary dissection involving the left main coronary artery extending into the LAD and left circumflex. The patient decompensated with cardiac catheterization. I placed an intra-aortic balloon pump and discussed the case with Dr. Dorris Fetch at [facility] who accepted the patient for emergent CABG. The patient received aspirin and 3000 units of unfractionated heparin. She did not get any other antiplatelet medications. She had one episode of bradycardia after left main coronary injection that responded to 0.5 mg of atropine. After balloon pump placement, chest pain improved significantly to 3 out of 10. There was localized oozing around the intra-aortic balloon pump sheath which improved with pressure dressing.   Critical care time of 60 minutes.  Signed: Lorine Bears MD, [facility] 01/14/2016, 5:08 PM

## 2016-01-14 NOTE — H&P (Signed)
Karina Flynn is an 35 y.o. female.    Chief Complaint: chest pain  HPI: 35 yo woman presented to [facility] ED today with a cc/o CP. Sudden onset this AM while taking a shower. Initial troponin 1.1. Repeat troponin= 8. Taken to cath lab. Left main dissection noted. IABP placed with improvement in CP. Brought directly to OR at [facility]. She was still having mild CP on arrival.  No past medical history on file.  Past Surgical History  Procedure Laterality Date  . Inner ear surgery      No family history on file. Social History:  reports that she has never smoked. She does not have any smokeless tobacco history on file. She reports that she does not drink alcohol or use illicit drugs.  Allergies:  Allergies  Allergen Reactions  . Ceclor [Cefaclor] Rash    No prescriptions prior to admission    Results for orders placed or performed during the hospital encounter of 01/14/16 (from the past 48 hour(s))  Type and screen     Status: None (Preliminary result)   Collection Time: 01/14/16  7:13 PM  Result Value Ref Range   ABO/RH(D) O POS    Antibody Screen NEG    Sample Expiration 01/17/2016    Unit Number [id number]    Blood Component Type RED CELLS,LR    Unit division 00    Status of Unit ISSUED    Transfusion Status OK TO TRANSFUSE    Crossmatch Result Compatible    Unit Number [id number]    Blood Component Type RED CELLS,LR    Unit division 00    Status of Unit ISSUED    Transfusion Status OK TO TRANSFUSE    Crossmatch Result Compatible    Unit Number [id number]    Blood Component Type RED CELLS,LR    Unit division 00    Status of Unit ISSUED    Transfusion Status OK TO TRANSFUSE    Crossmatch Result Compatible    Unit Number [id number]    Blood Component Type RED CELLS,LR    Unit division 00    Status of Unit ISSUED    Transfusion Status OK TO TRANSFUSE    Crossmatch Result Compatible   ABO/Rh     Status: None   Collection Time: 01/14/16  7:13 PM  Result  Value Ref Range   ABO/RH(D) O POS   Platelet count     Status: Abnormal   Collection Time: 01/14/16  9:03 PM  Result Value Ref Range   Platelets 112 (L) 150 - 400 K/uL    Comment: SPECIMEN CHECKED FOR CLOTS REPEATED TO VERIFY RESULT CALLED TO, READ BACK BY AND VERIFIED WITH: PAT WEATHERLY,RN AT 2115 01/14/16. K.PAXTON   Hemoglobin and hematocrit, blood     Status: Abnormal   Collection Time: 01/14/16  9:03 PM  Result Value Ref Range   Hemoglobin 6.2 (LL) 12.0 - 15.0 g/dL    Comment: REPEATED TO VERIFY CRITICAL RESULT CALLED TO, READ BACK BY AND VERIFIED WITH: PAT WEATHERLY,RN AT 2115 01/14/16. K.PAXTON    HCT 18.6 (L) 36.0 - 46.0 %    Comment: REPEATED TO VERIFY RESULT CALLED TO, READ BACK BY AND VERIFIED WITH: PAT WEATHERLY,RN AT 2115 01/14/16. K.PAXTON    Dg Chest 2 View  01/14/2016  CLINICAL DATA:  Chest pain beginning this morning. Initial encounter. EXAM: CHEST  2 VIEW COMPARISON:  None. FINDINGS: The lungs are clear. Heart size is normal. There is no pneumothorax or pleural effusion. No focal bony abnormality  is identified. IMPRESSION: No acute disease. Electronically Signed   By: Drusilla Kanner M.D.   On: 01/14/2016 10:54   Ct Angio Chest Aorta W/cm &/or Wo/cm  01/14/2016  CLINICAL DATA:  Chest pain extending through to the back. EXAM: CT ANGIOGRAPHY CHEST WITH CONTRAST TECHNIQUE: Multidetector CT imaging of the chest was performed using the standard protocol during bolus administration of intravenous contrast. Multiplanar CT image reconstructions and MIPs were obtained to evaluate the vascular anatomy. CONTRAST:  75mL OMNIPAQUE IOHEXOL 350 MG/ML SOLN COMPARISON:  Two-view chest x-ray 01/14/2016. FINDINGS: There is excellent opacification of the thoracic aorta. There is no aneurysm or dissection. No focal aortic L abnormality is present. The great vessel origins are within normal limits. A 3 vessel arch configuration is present. Pulmonary arteries are normally opacified without  evidence for pulmonary embolus. The heart size is normal. No significant pleural or pericardial effusion is present. The lungs are clear without focal nodule, mass, or airspace disease. The bone windows are unremarkable. Review of the MIP images confirms the above findings. IMPRESSION: Negative CTA of the chest. Electronically Signed   By: Marin Roberts M.D.   On: 01/14/2016 15:27    Review of Systems  Respiratory: Positive for shortness of breath.   Cardiovascular: Positive for chest pain.  All other systems reviewed and are negative.   Last menstrual period 12/10/2015. Physical Exam  Vitals reviewed. Constitutional: She is oriented to person, place, and time. She appears well-developed and well-nourished. She appears distressed (mild).  HENT:  Head: Normocephalic and atraumatic.  Eyes: EOM are normal. No scleral icterus.  Neck:  No bruits  Cardiovascular: Normal rate, regular rhythm, normal heart sounds and intact distal pulses.   No murmur heard. IABP right groin, some blood around site  Respiratory: Breath sounds normal.  GI: Soft. There is no tenderness.  Musculoskeletal: She exhibits no edema.  Neurological: She is alert and oriented to person, place, and time. No cranial nerve deficit.  Skin: Skin is warm and dry.     Assessment/Plan 35 yo woman with a spontaneous left main coronary dissection extending into LAD and circumflex. Still has some flow in LAD on cath, but circumflex totally occluded. She was brought to [facility] by [facility] with Dr. Kirke Corin accompanying her. Needs emergent CABG. I informed her of the need for CABG. She understands the general nature of the procedure and also understands the risks of complications including but not limited to death, stroke, bleeding, possible need for transfusion, infection, as well as numerous other potential complications that the urgent nature of the situation does not allow time to list in detail. She accepts all risks and agrees  to proceed.  She is to be taken directly to the OR. Anesthesiology present as well as nursing staff and perfusion.  Loreli Slot 01/14/2016, 10:38 PM

## 2016-01-14 NOTE — ED Notes (Signed)
Dr. Pershing Proud notified of troponin 1.13.

## 2016-01-14 NOTE — Transfer of Care (Signed)
Immediate Anesthesia Transfer of Care Note  Patient: Karina Flynn  Procedure(s) Performed: Procedure(s): Coronary artery bypass graft times using right internal mammary artery and left greater saphenous vein via endovein harvest. (Left)  Patient Location: ICU  Anesthesia Type:General  Level of Consciousness: sedated and Patient remains intubated per anesthesia plan  Airway & Oxygen Therapy: Patient remains intubated per anesthesia plan and Patient placed on Ventilator (see vital sign flow sheet for setting)  Post-op Assessment: Report given to RN and Post -op Vital signs reviewed and stable  Post vital signs: Reviewed and stable  Last Vitals: There were no vitals filed for this visit.  Complications: No apparent anesthesia complications

## 2016-01-14 NOTE — ED Notes (Signed)
MD at bedside. 

## 2016-01-14 NOTE — ED Notes (Signed)
Dr. Pershing Proud notified of troponin 8.03

## 2016-01-14 NOTE — Anesthesia Preprocedure Evaluation (Signed)
Anesthesia Evaluation  Patient identified by MRN, date of birth, ID band Patient awake    Reviewed: NPO status , Patient's Chart, lab work & pertinent test resultsPreop documentation limited or incomplete due to emergent nature of procedure.  Airway Mallampati: II  TM Distance: >3 FB Neck ROM: Full    Dental no notable dental hx.    Pulmonary neg pulmonary ROS,    Pulmonary exam normal breath sounds clear to auscultation       Cardiovascular + Past MI  Normal cardiovascular exam Rhythm:Regular Rate:Normal     Neuro/Psych negative neurological ROS  negative psych ROS   GI/Hepatic negative GI ROS, Neg liver ROS,   Endo/Other  negative endocrine ROS  Renal/GU negative Renal ROS     Musculoskeletal negative musculoskeletal ROS (+)   Abdominal   Peds  Hematology negative hematology ROS (+)   Anesthesia Other Findings   Reproductive/Obstetrics negative OB ROS                             Anesthesia Physical Anesthesia Plan  ASA: IV  Anesthesia Plan: General   Post-op Pain Management:    Induction: Intravenous  Airway Management Planned: Oral ETT  Additional Equipment: Arterial line, CVP, Ultrasound Guidance Line Placement, PA Cath and TEE  Intra-op Plan:   Post-operative Plan: Post-operative intubation/ventilation  Informed Consent: I have reviewed the patients History and Physical, chart, labs and discussed the procedure including the risks, benefits and alternatives for the proposed anesthesia with the patient or authorized representative who has indicated his/her understanding and acceptance.   Dental advisory given  Plan Discussed with: CRNA  Anesthesia Plan Comments:         Anesthesia Quick Evaluation

## 2016-01-14 NOTE — Anesthesia Procedure Notes (Addendum)
Central Venous Catheter Insertion Performed by: anesthesiologist Patient location: Pre-op. Preanesthetic checklist: patient identified, IV checked, site marked, risks and benefits discussed, surgical consent, monitors and equipment checked, pre-op evaluation, timeout performed and anesthesia consent Position: Trendelenburg Landmarks identified and Seldinger technique used Catheter size: 8.5 Fr Central line was placed.Sheath introducer Procedure performed without using ultrasound guided technique. Attempts: 1 Following insertion, line sutured. Post procedure assessment: blood return through all ports, free fluid flow and no air. Patient tolerated the procedure well with no immediate complications.    Central Venous Catheter Insertion Performed by: anesthesiologist Patient location: Pre-op. Preanesthetic checklist: patient identified, IV checked, site marked, risks and benefits discussed, surgical consent, monitors and equipment checked, pre-op evaluation, timeout performed and anesthesia consent Landmarks identified PA cath was placed.Swan type and PA catheter depth:thermodilation and 46PA Cath depth:46 Procedure performed without using ultrasound guided technique. Attempts: 1 Patient tolerated the procedure well with no immediate complications.    Procedure Name: LMA Insertion Date/Time: 01/14/2016 6:38 PM Performed by: Melina Schools Pre-anesthesia Checklist: Patient identified, Emergency Drugs available, Suction available, Patient being monitored and Timeout performed Patient Re-evaluated:Patient Re-evaluated prior to inductionOxygen Delivery Method: Circle system utilized Preoxygenation: Pre-oxygenation with 100% oxygen Intubation Type: IV induction, Rapid sequence and Cricoid Pressure applied Ventilation: Mask ventilation without difficulty Laryngoscope Size: Mac and 3 Grade View: Grade II Tube type: Oral Tube size: 7.5 mm Number of attempts: 1 Airway Equipment and  Method: Stylet Placement Confirmation: ETT inserted through vocal cords under direct vision,  positive ETCO2 and CO2 detector Secured at: 23 cm Tube secured with: Tape Dental Injury: Teeth and Oropharynx as per pre-operative assessment

## 2016-01-15 ENCOUNTER — Inpatient Hospital Stay (HOSPITAL_COMMUNITY): Payer: BLUE CROSS/BLUE SHIELD

## 2016-01-15 LAB — GLUCOSE, CAPILLARY
GLUCOSE-CAPILLARY: 80 mg/dL (ref 65–99)
Glucose-Capillary: 102 mg/dL — ABNORMAL HIGH (ref 65–99)
Glucose-Capillary: 94 mg/dL (ref 65–99)

## 2016-01-15 LAB — BASIC METABOLIC PANEL
ANION GAP: 6 (ref 5–15)
BUN: 8 mg/dL (ref 6–20)
CHLORIDE: 112 mmol/L — AB (ref 101–111)
CO2: 22 mmol/L (ref 22–32)
CREATININE: 0.7 mg/dL (ref 0.44–1.00)
Calcium: 7.6 mg/dL — ABNORMAL LOW (ref 8.9–10.3)
GFR calc non Af Amer: >60 mL/min (ref >60)
Glucose, Bld: 131 mg/dL — ABNORMAL HIGH (ref 65–99)
POTASSIUM: 4.7 mmol/L (ref 3.5–5.1)
SODIUM: 140 mmol/L (ref 135–145)

## 2016-01-15 LAB — POCT I-STAT 3, ART BLOOD GAS (G3+)
ACID-BASE DEFICIT: 6 mmol/L — AB (ref 0.0–2.0)
ACID-BASE DEFICIT: 4 mmol/L — AB (ref 0.0–2.0)
ACID-BASE DEFICIT: 5 mmol/L — AB (ref 0.0–2.0)
Acid-base deficit: 5 mmol/L — ABNORMAL HIGH (ref 0.0–2.0)
Bicarbonate: 19.8 mEq/L — ABNORMAL LOW (ref 20.0–24.0)
Bicarbonate: 20.5 mEq/L (ref 20.0–24.0)
Bicarbonate: 20.6 mEq/L (ref 20.0–24.0)
Bicarbonate: 20 mEq/L (ref 20.0–24.0)
O2 SAT: 92 %
O2 Saturation: 99 %
O2 Saturation: 100 %
O2 Saturation: 100 %
PCO2 ART: 42.1 mmHg (ref 35.0–45.0)
PCO2 ART: 38 mmHg (ref 35.0–45.0)
PCO2 ART: 38.7 mmHg (ref 35.0–45.0)
PO2 ART: 182 mmHg — AB (ref 80.0–100.0)
PO2 ART: 244 mmHg — AB (ref 80.0–100.0)
PO2 ART: 71 mmHg — AB (ref 80.0–100.0)
Patient temperature: 36.9
Patient temperature: 37.8
Patient temperature: 37.9
Patient temperature: 37.9
TCO2: 21 mmol/L (ref 0–100)
TCO2: 22 mmol/L (ref 0–100)
TCO2: 22 mmol/L (ref 0–100)
TCO2: 21 mmol/L (ref 0–100)
pCO2 arterial: 41.2 mmHg (ref 35.0–45.0)
pH, Arterial: 7.281 — ABNORMAL LOW (ref 7.350–7.450)
pH, Arterial: 7.309 — ABNORMAL LOW (ref 7.350–7.450)
pH, Arterial: 7.346 — ABNORMAL LOW (ref 7.350–7.450)
pH, Arterial: 7.326 — ABNORMAL LOW (ref 7.350–7.450)
pO2, Arterial: 188 mmHg — ABNORMAL HIGH (ref 80.0–100.0)

## 2016-01-15 LAB — CBC
HCT: 24.4 % — ABNORMAL LOW (ref 36.0–46.0)
HEMATOCRIT: 22.6 % — AB (ref 36.0–46.0)
HEMOGLOBIN: 7.4 g/dL — AB (ref 12.0–15.0)
Hemoglobin: 8 g/dL — ABNORMAL LOW (ref 12.0–15.0)
MCH: 30.1 pg (ref 26.0–34.0)
MCH: 29.7 pg (ref 26.0–34.0)
MCHC: 32.7 g/dL (ref 30.0–36.0)
MCHC: 32.8 g/dL (ref 30.0–36.0)
MCV: 91.9 fL (ref 78.0–100.0)
MCV: 90.7 fL (ref 78.0–100.0)
PLATELETS: 145 10*3/uL — AB (ref 150–400)
Platelets: 97 10*3/uL — ABNORMAL LOW (ref 150–400)
RBC: 2.46 MIL/uL — AB (ref 3.87–5.11)
RBC: 2.69 MIL/uL — ABNORMAL LOW (ref 3.87–5.11)
RDW: 13.2 % (ref 11.5–15.5)
RDW: 14.6 % (ref 11.5–15.5)
WBC: 19.5 10*3/uL — ABNORMAL HIGH (ref 4.0–10.5)
WBC: 14.1 10*3/uL — ABNORMAL HIGH (ref 4.0–10.5)

## 2016-01-15 LAB — PREPARE RBC (CROSSMATCH)

## 2016-01-15 LAB — POCT I-STAT, CHEM 8
BUN: 9 mg/dL (ref 6–20)
CHLORIDE: 106 mmol/L (ref 101–111)
CREATININE: 0.7 mg/dL (ref 0.44–1.00)
Calcium, Ion: 1.27 mmol/L — ABNORMAL HIGH (ref 1.12–1.23)
GLUCOSE: 105 mg/dL — AB (ref 65–99)
HCT: 21 % — ABNORMAL LOW (ref 36.0–46.0)
Hemoglobin: 7.1 g/dL — ABNORMAL LOW (ref 12.0–15.0)
POTASSIUM: 3.8 mmol/L (ref 3.5–5.1)
Sodium: 134 mmol/L — ABNORMAL LOW (ref 135–145)
TCO2: 22 mmol/L (ref 0–100)

## 2016-01-15 LAB — SURGICAL PCR SCREEN
MRSA, PCR: NEGATIVE
STAPHYLOCOCCUS AUREUS: NEGATIVE

## 2016-01-15 LAB — CK TOTAL AND CKMB (NOT AT ARMC)
CK TOTAL: 1860 U/L — AB (ref 38–234)
CK TOTAL: 3328 U/L — AB (ref 38–234)
CK, MB: 242.2 ng/mL — AB (ref 0.5–5.0)
Relative Index: 13 — ABNORMAL HIGH (ref 0.0–2.5)

## 2016-01-15 LAB — MAGNESIUM
MAGNESIUM: 3.3 mg/dL — AB (ref 1.7–2.4)
Magnesium: 2.5 mg/dL — ABNORMAL HIGH (ref 1.7–2.4)

## 2016-01-15 LAB — CREATININE, SERUM
Creatinine, Ser: 0.74 mg/dL (ref 0.44–1.00)
GFR calc Af Amer: >60 mL/min (ref >60)
GFR calc non Af Amer: >60 mL/min (ref >60)

## 2016-01-15 LAB — POCT ACTIVATED CLOTTING TIME: Activated Clotting Time: 116 seconds

## 2016-01-15 LAB — TROPONIN I: Troponin I: >65 ng/mL (ref <0.031)

## 2016-01-15 MED ORDER — CHLORHEXIDINE GLUCONATE 0.12% ORAL RINSE (MEDLINE KIT)
15.0000 mL | Freq: Two times a day (BID) | OROMUCOSAL | Status: DC
  Administered 2016-01-15: 15 mL via OROMUCOSAL

## 2016-01-15 MED ORDER — POTASSIUM CHLORIDE 10 MEQ/50ML IV SOLN
10.0000 meq | INTRAVENOUS | Status: AC
  Administered 2016-01-15 (×3): 10 meq via INTRAVENOUS

## 2016-01-15 MED ORDER — INSULIN ASPART 100 UNIT/ML ~~LOC~~ SOLN
0.0000 [IU] | SUBCUTANEOUS | Status: DC
  Administered 2016-01-15: 4 [IU] via SUBCUTANEOUS
  Administered 2016-01-15: 2 [IU] via SUBCUTANEOUS

## 2016-01-15 MED ORDER — INSULIN ASPART 100 UNIT/ML ~~LOC~~ SOLN: 0.0000 [IU] | SUBCUTANEOUS | Status: DC

## 2016-01-15 MED ORDER — FUROSEMIDE 10 MG/ML IJ SOLN
20.0000 mg | Freq: Once | INTRAMUSCULAR | Status: AC
  Administered 2016-01-15: 20 mg via INTRAVENOUS
  Filled 2016-01-15: qty 2

## 2016-01-15 MED ORDER — ALBUMIN HUMAN 5 % IV SOLN
12.5000 g | INTRAVENOUS | Status: AC | PRN
  Administered 2016-01-15 (×2): 12.5 g via INTRAVENOUS
  Filled 2016-01-15: qty 500

## 2016-01-15 MED ORDER — ANTISEPTIC ORAL RINSE SOLUTION (CORINZ)
7.0000 mL | Freq: Four times a day (QID) | OROMUCOSAL | Status: DC
  Administered 2016-01-15 – 2016-01-16 (×4): 7 mL via OROMUCOSAL

## 2016-01-15 MED ORDER — ALBUMIN HUMAN 5 % IV SOLN
INTRAVENOUS | Status: AC
  Filled 2016-01-15: qty 250

## 2016-01-15 MED ORDER — SODIUM CHLORIDE 0.9 % IV SOLN: Freq: Once | INTRAVENOUS | Status: DC

## 2016-01-15 MED ORDER — INSULIN ASPART 100 UNIT/ML ~~LOC~~ SOLN
0.0000 [IU] | SUBCUTANEOUS | Status: DC
  Administered 2016-01-15: 2 [IU] via SUBCUTANEOUS

## 2016-01-15 NOTE — Procedures (Signed)
Extubation Procedure Note  Patient Details:   Name: Karina Flynn DOB: 1981/10/16 MRN: [id number]   Airway Documentation:     Evaluation  O2 sats: stable throughout Complications: No apparent complications Patient did tolerate procedure well. Bilateral Breath Sounds: Clear, Diminished Suctioning: Airway Yes  Patient extubated without any complications. Patient able to provide strong cough and is able to speak. Patient placed on 4LNC  And all vitals are are stable.  Sharene Skeans 01/15/2016, 6:39 AM

## 2016-01-15 NOTE — Progress Notes (Addendum)
1 Day Post-Op Procedure(s) (LRB): Coronary artery bypass graft times using right internal mammary artery and left greater saphenous vein via endovein harvest. (Left) Subjective: Feels better this AM No anginal pain "A little sore" Wants to get OOB  Objective: Vital signs in last 24 hours: Temp:  [97.5 F (36.4 C)-100.4 F (38 C)] 100.4 F (38 C) (01/21 0915) Pulse Rate:  [29-146] 29 (01/21 0915) Cardiac Rhythm:  [-] Normal sinus rhythm (01/21 0800) Resp:  [6-33] 18 (01/21 0915) BP: (67-122)/(55-93) 80/61 mmHg (01/21 0900) SpO2:  [96 %-100 %] 97 % (01/21 0915) Arterial Line BP: (67-141)/(43-68) 90/54 mmHg (01/21 0915) FiO2 (%):  [40 %-50 %] 40 % (01/21 0600) Weight:  [120 lb (54.432 kg)-125 lb 10.6 oz (57 kg)] 125 lb 10.6 oz (57 kg) (01/21 0500)  Hemodynamic parameters for last 24 hours: PAP: (14-38)/(8-20) 37/18 mmHg CO:  [2.9 L/min-4.3 L/min] 4.3 L/min CI:  [1.7 L/min/m2-2.6 L/min/m2] 2.6 L/min/m2  Intake/Output from previous day: 01/20 0701 - 01/21 0700 In: 5487.4 [I.V.:2835.4; Blood:372; NG/GT:30; IV Piggyback:2250] Out: 3140 [Urine:1910; Blood:700; Chest Tube:530] Intake/Output this shift: Total I/O In: 127.5 [I.V.:127.5] Out: 145 [Urine:75; Chest Tube:70]  General appearance: alert, cooperative and no distress Neurologic: intact Heart: regular rate and rhythm Lungs: diminished breath sounds bibasilar Abdomen: normal findings: soft, non-tender EXT well perfused  Lab Results:  Recent Labs  01/14/16 2300 01/15/16 0451  WBC 18.5* 19.5*  HGB 8.8*  9.1* 7.4*  HCT 26.0*  27.1* 22.6*  PLT 93* 97*   BMET:  Recent Labs  01/14/16 1221 01/14/16 2300 01/15/16 0451  NA 139 141 140  K 3.9 3.5 4.7  CL 109 104 112*  CO2 24  --  22  GLUCOSE 161* 116* 131*  BUN CREATININE 0.78 0.40* 0.70  CALCIUM 9.0  --  7.6*    PT/INR:  Recent Labs  01/14/16 2300  LABPROT 20.8*  INR 1.80*   ABG    Component Value Date/Time   PHART 7.309* 01/15/2016 0623   HCO3 20.5 01/15/2016 0623   TCO2 22 01/15/2016 0623   ACIDBASEDEF 5.0* 01/15/2016 0623   O2SAT 100.0 01/15/2016 0623   CBG (last 3)   Recent Labs  01/15/16 0010 01/15/16 0057  GLUCAP 80 102*    Assessment/Plan: S/P Procedure(s) (LRB): Coronary artery bypass graft times using right internal mammary artery and left greater saphenous vein via endovein harvest. (Left) POD # 1 emergency CABg  CV- s/p MI due to left main dissection. Troponin > 65, MB > 260. She had a large anterolateral infarct with > 6 hours to revascularization  Good index with IABP at 1:3- dc IABP  ASA, betablocker  RESP- IS  RENAL- creatinine and lytes OK  ENDO- CBG well controlled  Anemia- secondary to ABL  Thrombocytopenia- no active bleeding but will give platelets prior to removing IABP   LOS: 1 day    Loreli Slot 01/15/2016  Addendum  Correction of above procedure- only left internal mammary artery used for bypass grafting.

## 2016-01-15 NOTE — Progress Notes (Signed)
Performed parameters with patient Per Rapid Wean results are as follows. NIF -30, VC 1L.

## 2016-01-15 NOTE — Progress Notes (Signed)
IABP removed from right femoral artery and pressure held x 30 minutes.  Vitals stable throughout hold.  Distal pulses 2 + DP.  Site dressed with 4x4 and tegaderm.  Instructions given for bedrest.  RN will continue to monitor.

## 2016-01-15 NOTE — Progress Notes (Signed)
Patient has been switched back to full support at this time(Vt 510, RR 12, PEEP 5, and FIO2 50%) due to ABG results. Will attempt again at a later time.

## 2016-01-15 NOTE — Progress Notes (Signed)
CRITICAL VALUE ALERT  Critical value received:  Troponin 65     CKMB 260  Date of notification:  01/15/16  Time of notification:  0100  Critical value read back:Yes.    Nurse who received alert:  Herbert Deaner, RN/ Ron Agee  MD notified (1st page):  N/a Dr Dorris Fetch aware will be extremely elevated, dose not want to be notified will see results in am

## 2016-01-15 NOTE — Op Note (Signed)
NAME:  Karina Flynn, Karina Flynn NO.:  [id number]  MEDICAL RECORD NO.:  [id number]  LOCATION:  2S08C                        FACILITY:  [facility]  PHYSICIAN:  Salvatore Decent. Dorris Fetch, M.D.DATE OF BIRTH:  1981-11-08  DATE OF PROCEDURE:  01/14/2016 DATE OF DISCHARGE:                              OPERATIVE REPORT   PREOPERATIVE DIAGNOSIS:  Left main coronary artery dissection, with anterolateral myocardial infarction (MI).  POSTOPERATIVE DIAGNOSIS:  Left main coronary artery dissection, with anterolateral myocardial infarction (MI).  PROCEDURE: 1. Median sternotomy extracorporeal circulation. 2. Coronary artery bypass grafting x 2  Left internal mammary artery to left anterior descending  Saphenous vein graft to obtuse marginal 1  Endoscopic vein harvest, left thigh.  SURGEON:  Salvatore Decent. Dorris Fetch, M.D.  ASSISTANT:  Gershon Crane, PA-C.  ANESTHESIA:  General.  FINDINGS:  Dissection present at the proximal end of both anastomoses. Probe passed easily proximally and distally after each anastomosis. Saphenous vein good quality.  Mammary was small caliber, but with good flow.  Transesophageal echocardiography revealed anterolateral severe hypokinesis.  No significant valvular pathology.  CLINICAL NOTE:  Karina Flynn is a 35 year old woman, with no prior cardiac history, who had a sudden onset of chest pain earlier today.  She presented to the emergency room at [facility].  Her initial troponin was mildly elevated, but the subsequent troponin was up to 8. She was taken emergently to the catheterization laboratory, where she was found to have a spontaneous left main coronary dissection.  An intra-aortic balloon pump was placed and the patient was transported directly to the [facility] operating room for coronary artery bypass grafting.  I saw the patient in the preoperative holding area on arrival, and informed her of the need for emergent coronary bypass grafting.  She understood  the general nature of the procedure as well as the risks.  She accepted the risks and agreed to proceed.  OPERATIVE NOTE:  Karina Flynn was brought directly from the preop holding area to the operating room.  She had induction of general anesthesia. An Intra-aortic balloon pump was in place at one-to-one.  Dr. Maple Hudson of Anesthesia placed a Swan-Ganz catheter.  An arterial line was placed. Transesophageal echocardiography was performed, while the patient's chest, abdomen, and legs were being prepped and draped in usual sterile fashion.  Intravenous antibiotics were administered.  Transesophageal echocardiography showed significant anterolateral hypo- to-akinesis.  There was no significant valvular pathology.  A median sternotomy was performed. At the same time, an incision was made in the medial aspect of the left leg at the level of the knee, the greater saphenous vein was identified and was harvested endoscopically from the left thigh.  A sternal retractor was placed after performing the sternotomy and the pericardium was opened.  The ascending aorta was inspected.  It was of normal size with no evidence of atherosclerotic disease.  There was no evidence of aortic dissection.  The patient was fully heparinized.  After confirming adequate anticoagulation with ACT measurement, the aorta was cannulated via concentric 2-0 Ethibond pledgeted pursestring sutures.  A dual-stage venous cannula was placed via a pursestring in the right atrial appendage.  Cardiopulmonary bypass was initiated.  Flows were maintained per  protocol.  The patient was maintained at normothermia initially.  The sternal retractor was removed and the Rultract retractor was placed.  The left internal mammary artery then was harvested under direct vision.  It was relatively a small vessel, but did accept a 1.5 mm probe.  It had excellent flow.  The vein was of good quality.  After harvesting the conduits, the sternal  retractor was replaced.  The coronary arteries were inspected and anastomotic sites were chosen.  A foam pad was placed in the pericardium to insulate the heart.  A temperature probe was placed in the myocardial septum.  The patient was cooled to 34 degrees Celsius.  A retrograde cardioplegia cannula was placed via pursestring suture in the right atrium and directed into the coronary sinus.  An antegrade cardioplegia cannula was placed in the ascending aorta.  The aorta was crossclamped.  The left ventricle was emptied via the aortic root vent.  Cardiac arrest then was achieved with a combination of cold antegrade and retrograde blood cardioplegia.  An initial 300 mL of cardioplegia was administered antegrade.  There was a rapid diastolic arrest.  500 mL was administered retrograde.  There was septal cooling to 9 degrees Celsius.  A reversed saphenous vein graft then was placed end-to-side to OM 1. The site selected for anastomosis appeared relatively normal.  An arteriotomy was made.  There was a dissection present proximally, but it did not extend beyond the distal portion of the target vessel.  The vein was anastomosed end-to-side with a running 7-0 Prolene suture.  A 1.5 mm probe passed easily proximally and distally.  At the completion of the anastomosis, cardioplegia was administered.  There was a good flow and good hemostasis.  Additional cardioplegia was administered via the retrograde cannula again.  The left internal mammary artery was brought through a window in the pericardium.  The distal end was beveled.  Again the LIMA did accept a 1.5 mm probe.  It was anastomosed end-to-side to the distal LAD.  The LAD was opened in an area the artery that appeared normal.  On making the arteriotomy, there was a dissection flap present, again this involved the proximal area of where the arteriotomy was made, but not the distal end.  The mammary to LAD anastomosis was performed with  a running 8-0 Prolene suture.  At the completion of anastomosis, a probe was carefully passed proximally and distally in the LAD as well as retrograde in the left mammary artery and passed easily in all directions.  The bulldog clamp was briefly removed to inspect for hemostasis.  Septal rewarming was noted.  The bulldog clamp was replaced.  The cardioplegia cannula was removed from the ascending aorta.  The proximal vein graft anastomosis was performed to a 4.5 mm punch aortotomy with a running 6-0 Prolene suture.  At the completion the proximal anastomosis, the patient was placed in Trendelenburg position. Lidocaine was administered.  The aortic root was de-aired and the aortic crossclamp was removed.  The total crossclamp time was 41 minutes.  The patient initially fibrillated.  She required 2 defibrillations with 10 joules and then was in sinus rhythm thereafter.  While rewarming was completed, all proximal and distal anastomoses were inspected for hemostasis.  Epicardial pacing wires were placed on the right ventricle and right atrium.  The retrograde cardioplegia cannula was removed.  The patient had rewarmed to a core temperature of 37 degrees Celsius.  She was weaned from cardiopulmonary bypass on the first attempt without  difficulty.  The intraaortic balloon pump was on at 1:3 initially and then 1:2.  The patient weaned from bypass without difficulty.  The initial cardiac index was greater than 2 L/minute/m2.  The transesophageal echocardiography showed no change in left ventricular function.  A test dose of protamine was administered and was well-tolerated.  The atrial and aortic cannulae were removed.  The remainder of the protamine was administered without incident.  The chest was irrigated with warm saline.  Hemostasis was achieved.  The pericardium was reapproximated over the ascending aorta and base of the heart with interrupted 3-0 silk sutures.  Left pleural and  mediastinal chest tubes were placed via separate subcostal incisions.  The sternum was closed with interrupted heavy gauge stainless steel wires.  The pectoralis fascia, subcutaneous tissue, and skin were closed in a standard fashion.  All sponge, needle, and instrument counts were correct at the end of the procedure.  The patient was taken from the operating room to the surgical intensive care unit in a critical, but stable condition.     Salvatore Decent Dorris Fetch, M.D.     SCH/MEDQ  D:  01/14/2016  T:  01/15/2016  Job:  [id number]

## 2016-01-15 NOTE — Progress Notes (Signed)
      [address]       [place] [address]             [phone]      Feels better this afternoon  BP 90/51 mmHg  Pulse 88  Temp(Src) 99.3 F (37.4 C) (Oral)  Resp 0  Wt 125 lb 10.6 oz (57 kg)  SpO2 100%  LMP 12/10/2015 (Approximate)   Intake/Output Summary (Last 24 hours) at 01/15/16 1729 Last data filed at 01/15/16 1500  Gross per 24 hour  Intake 6828.1 ml  Output   4130 ml  Net 2698.1 ml   Ck trending down  Doing well  Viviann Spare C. Dorris Fetch, MD [facility] [phone]

## 2016-01-15 NOTE — Anesthesia Postprocedure Evaluation (Signed)
Anesthesia Post Note  Patient: Karina Flynn  Procedure(s) Performed: Procedure(s) (LRB): Coronary artery bypass graft times using right internal mammary artery and left greater saphenous vein via endovein harvest. (Left)  Patient location during evaluation: SICU Anesthesia Type: General Level of consciousness: sedated and patient remains intubated per anesthesia plan Pain management: pain level controlled Vital Signs Assessment: vitals unstable Respiratory status: patient remains intubated per anesthesia plan Anesthetic complications: no    Last Vitals:  Filed Vitals:   01/15/16 0145 01/15/16 0200  BP:  85/60  Pulse: 43 45  Temp: 36.8 C 36.8 C  Resp: 12 12    Last Pain: There were no vitals filed for this visit.               Lewie Loron

## 2016-01-16 ENCOUNTER — Encounter (HOSPITAL_COMMUNITY): Payer: Self-pay

## 2016-01-16 ENCOUNTER — Inpatient Hospital Stay (HOSPITAL_COMMUNITY): Payer: BLUE CROSS/BLUE SHIELD

## 2016-01-16 LAB — CBC
HCT: 26.2 % — ABNORMAL LOW (ref 36.0–46.0)
HEMOGLOBIN: 8.5 g/dL — AB (ref 12.0–15.0)
MCH: 29.6 pg (ref 26.0–34.0)
MCHC: 32.4 g/dL (ref 30.0–36.0)
MCV: 91.3 fL (ref 78.0–100.0)
PLATELETS: 123 10*3/uL — AB (ref 150–400)
RBC: 2.87 MIL/uL — AB (ref 3.87–5.11)
RDW: 14.6 % (ref 11.5–15.5)
WBC: 16.5 10*3/uL — ABNORMAL HIGH (ref 4.0–10.5)

## 2016-01-16 LAB — BASIC METABOLIC PANEL
ANION GAP: 6 (ref 5–15)
BUN: 9 mg/dL (ref 6–20)
CALCIUM: 8.2 mg/dL — AB (ref 8.9–10.3)
CO2: 24 mmol/L (ref 22–32)
Chloride: 105 mmol/L (ref 101–111)
Creatinine, Ser: 0.71 mg/dL (ref 0.44–1.00)
GLUCOSE: 99 mg/dL (ref 65–99)
POTASSIUM: 4.8 mmol/L (ref 3.5–5.1)
Sodium: 135 mmol/L (ref 135–145)

## 2016-01-16 LAB — GLUCOSE, CAPILLARY
GLUCOSE-CAPILLARY: 92 mg/dL (ref 65–99)
GLUCOSE-CAPILLARY: 92 mg/dL (ref 65–99)
GLUCOSE-CAPILLARY: 107 mg/dL — AB (ref 65–99)
Glucose-Capillary: 139 mg/dL — ABNORMAL HIGH (ref 65–99)
Glucose-Capillary: 139 mg/dL — ABNORMAL HIGH (ref 65–99)
Glucose-Capillary: 163 mg/dL — ABNORMAL HIGH (ref 65–99)
Glucose-Capillary: 100 mg/dL — ABNORMAL HIGH (ref 65–99)
Glucose-Capillary: 101 mg/dL — ABNORMAL HIGH (ref 65–99)

## 2016-01-16 LAB — PREPARE PLATELET PHERESIS: UNIT DIVISION: 0

## 2016-01-16 MED ORDER — CARVEDILOL 3.125 MG PO TABS
3.1250 mg | ORAL_TABLET | Freq: Two times a day (BID) | ORAL | Status: DC
  Administered 2016-01-16: 3.125 mg via ORAL
  Filled 2016-01-16: qty 1

## 2016-01-16 MED ORDER — ENOXAPARIN SODIUM 40 MG/0.4ML ~~LOC~~ SOLN
40.0000 mg | SUBCUTANEOUS | Status: DC
  Administered 2016-01-16 – 2016-01-18 (×3): 40 mg via SUBCUTANEOUS
  Filled 2016-01-16 (×3): qty 0.4

## 2016-01-16 NOTE — Progress Notes (Signed)
      [address]       [place] [address]             [phone]       Up in chair  C/o nausea, not much appetite  BP 105/72 mmHg  Pulse 110  Temp(Src) 99.1 F (37.3 C) (Oral)  Resp 24  Ht  (1.727 m)  Wt 139 lb 8.8 oz (63.3 kg)  BMI 21.22 kg/m2  SpO2 100%  LMP 12/10/2015 (Approximate)   Intake/Output Summary (Last 24 hours) at 01/16/16 1948 Last data filed at 01/16/16 1900  Gross per 24 hour  Intake   1020 ml  Output   1695 ml  Net   -675 ml    Stable day  Viviann Spare C. Dorris Fetch, MD [facility] [phone]

## 2016-01-16 NOTE — Progress Notes (Signed)
2 Days Post-Op Procedure(s) (LRB): Coronary artery bypass graft times using right internal mammary artery and left greater saphenous vein via endovein harvest. (Left) Subjective: Feels better today Still some incisional pain  Objective: Vital signs in last 24 hours: Temp:  [98.6 F (37 C)-100.6 F (38.1 C)] 98.6 F (37 C) (01/22 0900) Pulse Rate:  [28-172] 87 (01/22 0815) Cardiac Rhythm:  [-] Normal sinus rhythm (01/22 0800) Resp:  [0-35] 13 (01/22 0815) BP: (80-90)/(48-52) 90/51 mmHg (01/21 1400) SpO2:  [87 %-100 %] 98 % (01/22 0815) Arterial Line BP: (79-111)/(48-75) 99/75 mmHg (01/22 0815) Weight:  [139 lb 8.8 oz (63.3 kg)] 139 lb 8.8 oz (63.3 kg) (01/22 0630)  Hemodynamic parameters for last 24 hours: PAP: (29-34)/(16-19) 33/19 mmHg CO:  [4.2 L/min] 4.2 L/min CI:  [2.5 L/min/m2] 2.5 L/min/m2  Intake/Output from previous day: 01/21 0701 - 01/22 0700 In: 2275.5 [I.V.:1414.5; Blood:611; IV Piggyback:250] Out: 1565 [Urine:1275; Chest Tube:290] Intake/Output this shift: Total I/O In: 40 [I.V.:40] Out: 115 [Urine:115]  General appearance: alert, cooperative and no distress Neurologic: intact Heart: regular rate and rhythm Lungs: diminished breath sounds bibasilar Abdomen: normal findings: soft, non-tender  Lab Results:  Recent Labs  01/15/16 1645 01/15/16 1648 01/16/16 0620  WBC 14.1*  --  16.5*  HGB 8.0* 7.1* 8.5*  HCT 24.4* 21.0* 26.2*  PLT 145*  --  123*   BMET:  Recent Labs  01/15/16 0451  01/15/16 1648 01/16/16 0620  NA 140  --  134* 135  K 4.7  --  3.8 4.8  CL 112*  --  106 105  CO2 22  --   --  24  GLUCOSE 131*  --  105* 99  BUN 8  --  9 9  CREATININE 0.70  < > 0.70 0.71  CALCIUM 7.6*  --   --  8.2*  < > = values in this interval not displayed.  PT/INR:  Recent Labs  01/14/16 2300  LABPROT 20.8*  INR 1.80*   ABG    Component Value Date/Time   PHART 7.326* 01/15/2016 0812   HCO3 20.0 01/15/2016 0812   TCO2 22 01/15/2016 1648   ACIDBASEDEF 5.0* 01/15/2016 0812   O2SAT 92.0 01/15/2016 0812   CBG (last 3)   Recent Labs  01/15/16 1951 01/15/16 2336 01/16/16 0615  GLUCAP 94 100* 101*    Assessment/Plan: S/P Procedure(s) (LRB): Coronary artery bypass graft times using right internal mammary artery and left greater saphenous vein via endovein harvest. (Left) -  CV- BP still soft but weaning neo  S/p MI due to left main dissection, change metoprolol to carvedilol  ASA for grafts  She had a large infarct by echo and enzymes  RESP- continue IS, still has a small air leak- keep CT in place  RENAL- creatinine and lytes OK  Dc Foley  ENDO- CBG well controlled- dc CBGs  DVT prophylaxis- SCD in place, will add enoxaparin now that IABP out  mobilize   LOS: 2 days    Loreli Slot 01/16/2016

## 2016-01-17 ENCOUNTER — Inpatient Hospital Stay (HOSPITAL_COMMUNITY): Payer: BLUE CROSS/BLUE SHIELD

## 2016-01-17 ENCOUNTER — Encounter: Payer: Self-pay | Admitting: Cardiovascular Disease

## 2016-01-17 LAB — POCT I-STAT, CHEM 8
BUN: 10 mg/dL (ref 6–20)
BUN: 10 mg/dL (ref 6–20)
BUN: 8 mg/dL (ref 6–20)
BUN: 8 mg/dL (ref 6–20)
CALCIUM ION: 1.2 mmol/L (ref 1.12–1.23)
CALCIUM ION: 1.01 mmol/L — AB (ref 1.12–1.23)
CHLORIDE: 107 mmol/L (ref 101–111)
CHLORIDE: 102 mmol/L (ref 101–111)
CREATININE: 0.3 mg/dL — AB (ref 0.44–1.00)
Calcium, Ion: 1.2 mmol/L (ref 1.12–1.23)
Calcium, Ion: 0.94 mmol/L — ABNORMAL LOW (ref 1.12–1.23)
Chloride: 105 mmol/L (ref 101–111)
Chloride: 100 mmol/L — ABNORMAL LOW (ref 101–111)
Creatinine, Ser: 0.3 mg/dL — ABNORMAL LOW (ref 0.44–1.00)
Creatinine, Ser: <0.2 mg/dL — ABNORMAL LOW (ref 0.44–1.00)
Creatinine, Ser: 0.4 mg/dL — ABNORMAL LOW (ref 0.44–1.00)
GLUCOSE: 100 mg/dL — AB (ref 65–99)
GLUCOSE: 214 mg/dL — AB (ref 65–99)
Glucose, Bld: 112 mg/dL — ABNORMAL HIGH (ref 65–99)
Glucose, Bld: 133 mg/dL — ABNORMAL HIGH (ref 65–99)
HCT: 20 % — ABNORMAL LOW (ref 36.0–46.0)
HEMATOCRIT: 31 % — AB (ref 36.0–46.0)
HEMATOCRIT: 36 % (ref 36.0–46.0)
HEMATOCRIT: 23 % — AB (ref 36.0–46.0)
HEMOGLOBIN: 10.5 g/dL — AB (ref 12.0–15.0)
HEMOGLOBIN: 7.8 g/dL — AB (ref 12.0–15.0)
Hemoglobin: 12.2 g/dL (ref 12.0–15.0)
Hemoglobin: 6.8 g/dL — CL (ref 12.0–15.0)
POTASSIUM: 4.1 mmol/L (ref 3.5–5.1)
POTASSIUM: 4.3 mmol/L (ref 3.5–5.1)
POTASSIUM: 4.3 mmol/L (ref 3.5–5.1)
Potassium: 5.6 mmol/L — ABNORMAL HIGH (ref 3.5–5.1)
SODIUM: 138 mmol/L (ref 135–145)
SODIUM: 139 mmol/L (ref 135–145)
SODIUM: 132 mmol/L — AB (ref 135–145)
Sodium: 137 mmol/L (ref 135–145)
TCO2: 24 mmol/L (ref 0–100)
TCO2: 24 mmol/L (ref 0–100)
TCO2: 20 mmol/L (ref 0–100)
TCO2: 24 mmol/L (ref 0–100)

## 2016-01-17 LAB — BASIC METABOLIC PANEL
ANION GAP: 6 (ref 5–15)
BUN: 11 mg/dL (ref 6–20)
CHLORIDE: 104 mmol/L (ref 101–111)
CO2: 27 mmol/L (ref 22–32)
Calcium: 8.3 mg/dL — ABNORMAL LOW (ref 8.9–10.3)
Creatinine, Ser: 0.66 mg/dL (ref 0.44–1.00)
Glucose, Bld: 93 mg/dL (ref 65–99)
POTASSIUM: 4.1 mmol/L (ref 3.5–5.1)
SODIUM: 137 mmol/L (ref 135–145)

## 2016-01-17 LAB — POCT I-STAT 3, ART BLOOD GAS (G3+)
Acid-base deficit: 3 mmol/L — ABNORMAL HIGH (ref 0.0–2.0)
Acid-base deficit: 8 mmol/L — ABNORMAL HIGH (ref 0.0–2.0)
Acid-base deficit: 1 mmol/L (ref 0.0–2.0)
BICARBONATE: 22.2 meq/L (ref 20.0–24.0)
BICARBONATE: 17.6 meq/L — AB (ref 20.0–24.0)
Bicarbonate: 23.1 mEq/L (ref 20.0–24.0)
O2 SAT: 100 %
O2 Saturation: 100 %
O2 Saturation: 100 %
PCO2 ART: 39.1 mmHg (ref 35.0–45.0)
PCO2 ART: 35.7 mmHg (ref 35.0–45.0)
PH ART: 7.363 (ref 7.350–7.450)
PH ART: 7.319 — AB (ref 7.350–7.450)
PH ART: 7.418 (ref 7.350–7.450)
PO2 ART: 462 mmHg — AB (ref 80.0–100.0)
PO2 ART: 432 mmHg — AB (ref 80.0–100.0)
TCO2: 23 mmol/L (ref 0–100)
TCO2: 19 mmol/L (ref 0–100)
TCO2: 24 mmol/L (ref 0–100)
pCO2 arterial: 34.2 mmHg — ABNORMAL LOW (ref 35.0–45.0)
pO2, Arterial: 413 mmHg — ABNORMAL HIGH (ref 80.0–100.0)

## 2016-01-17 LAB — CBC
HCT: 25.4 % — ABNORMAL LOW (ref 36.0–46.0)
HEMOGLOBIN: 8.4 g/dL — AB (ref 12.0–15.0)
MCH: 30.2 pg (ref 26.0–34.0)
MCHC: 33.1 g/dL (ref 30.0–36.0)
MCV: 91.4 fL (ref 78.0–100.0)
PLATELETS: 106 10*3/uL — AB (ref 150–400)
RBC: 2.78 MIL/uL — AB (ref 3.87–5.11)
RDW: 14.1 % (ref 11.5–15.5)
WBC: 10.1 10*3/uL (ref 4.0–10.5)

## 2016-01-17 MED ORDER — ALPRAZOLAM 0.25 MG PO TABS: 0.2500 mg | ORAL_TABLET | Freq: Four times a day (QID) | ORAL | Status: DC | PRN

## 2016-01-17 MED ORDER — SODIUM CHLORIDE 0.9 % IJ SOLN: 3.0000 mL | INTRAMUSCULAR | Status: DC | PRN

## 2016-01-17 MED ORDER — ALUM & MAG HYDROXIDE-SIMETH 200-200-20 MG/5ML PO SUSP: 15.0000 mL | ORAL | Status: DC | PRN

## 2016-01-17 MED ORDER — MOVING RIGHT ALONG BOOK
Freq: Once | Status: DC
  Filled 2016-01-17 (×2): qty 1

## 2016-01-17 MED ORDER — CARVEDILOL 3.125 MG PO TABS
3.1250 mg | ORAL_TABLET | Freq: Two times a day (BID) | ORAL | Status: DC
  Administered 2016-01-17 – 2016-01-19 (×6): 3.125 mg via ORAL
  Filled 2016-01-17 (×6): qty 1

## 2016-01-17 MED ORDER — SODIUM CHLORIDE 0.9 % IJ SOLN
3.0000 mL | Freq: Two times a day (BID) | INTRAMUSCULAR | Status: DC
  Administered 2016-01-17 – 2016-01-20 (×6): 3 mL via INTRAVENOUS

## 2016-01-17 MED ORDER — ZOLPIDEM TARTRATE 5 MG PO TABS: 5.0000 mg | ORAL_TABLET | Freq: Every evening | ORAL | Status: DC | PRN

## 2016-01-17 MED ORDER — SODIUM CHLORIDE 0.9 % IV SOLN: 250.0000 mL | INTRAVENOUS | Status: DC | PRN

## 2016-01-17 MED ORDER — MAGNESIUM HYDROXIDE 400 MG/5ML PO SUSP: 30.0000 mL | Freq: Every day | ORAL | Status: DC | PRN

## 2016-01-17 MED FILL — Sodium Chloride IV Soln 0.9%: INTRAVENOUS | Qty: 2000 | Status: AC

## 2016-01-17 MED FILL — Potassium Chloride Inj 2 mEq/ML: INTRAVENOUS | Qty: 40 | Status: AC

## 2016-01-17 MED FILL — Heparin Sodium (Porcine) Inj 1000 Unit/ML: INTRAMUSCULAR | Qty: 10 | Status: AC

## 2016-01-17 MED FILL — Magnesium Sulfate Inj 50%: INTRAMUSCULAR | Qty: 10 | Status: AC

## 2016-01-17 MED FILL — Lidocaine HCl IV Inj 20 MG/ML: INTRAVENOUS | Qty: 5 | Status: AC

## 2016-01-17 MED FILL — Heparin Sodium (Porcine) Inj 1000 Unit/ML: INTRAMUSCULAR | Qty: 30 | Status: AC

## 2016-01-17 MED FILL — Sodium Bicarbonate IV Soln 8.4%: INTRAVENOUS | Qty: 100 | Status: AC

## 2016-01-17 MED FILL — Electrolyte-R (PH 7.4) Solution: INTRAVENOUS | Qty: 3000 | Status: AC

## 2016-01-17 NOTE — Progress Notes (Signed)
      [address]       [place] [address]             [phone]      Bilateral pneumothoraces after CT removal.  She is in no distress. Denies CP and SOB.  BP 91/67 mmHg  Pulse 96  Temp(Src) 98.6 F (37 C) (Oral)  Resp 16  Ht  (1.727 m)  Wt 125 lb (56.7 kg)  BMI 19.01 kg/m2  SpO2 92%  LMP 12/10/2015 (Approximate)  Will recheck CXR in AM  Kenslee Achorn C. Dorris Fetch, MD [facility] [phone]

## 2016-01-17 NOTE — Progress Notes (Addendum)
3 Days Post-Op   Subjective: Feels better this morning  Objective: Vital signs in last 24 hours: Temp:  [97.8 F (36.6 C)-99.1 F (37.3 C)] 98.7 F (37.1 C) (01/23 0400) Pulse Rate:  [85-131] 131 (01/23 0700) Cardiac Rhythm:  [-] Normal sinus rhythm (01/23 0400) Resp:  [10-25] 22 (01/23 0700) BP: (85-105)/(58-77) 92/61 mmHg (01/23 0600) SpO2:  [88 %-100 %] 96 % (01/23 0700) Arterial Line BP: (96-110)/(61-79) 110/78 mmHg (01/22 0900) Weight:  [125 lb (56.7 kg)] 125 lb (56.7 kg) (01/23 0700)  Hemodynamic parameters for last 24 hours:    Intake/Output from previous day: 01/22 0701 - 01/23 0700 In: 460 [P.O.:280; I.V.:180] Out: 1850 [Urine:1110; Chest Tube:740] Intake/Output this shift:    General appearance: alert, cooperative and no distress Neurologic: intact Heart: regular rate and rhythm Lungs: clear to auscultation bilaterally Abdomen: normal findings: soft, non-tender Extremities: no edema  Lab Results:  Recent Labs  01/16/16 0620 01/17/16 0545  WBC 16.5* 10.1  HGB 8.5* 8.4*  HCT 26.2* 25.4*  PLT 123* 106*   BMET:  Recent Labs  01/16/16 0620 01/17/16 0545  NA 135 137  K 4.8 4.1  CL 105 104  CO2 24 27  GLUCOSE 99 93  BUN 9 11  CREATININE 0.71 0.66  CALCIUM 8.2* 8.3*    PT/INR:  Recent Labs  01/14/16 2300  LABPROT 20.8*  INR 1.80*   ABG    Component Value Date/Time   PHART 7.326* 01/15/2016 0812   HCO3 20.0 01/15/2016 0812   TCO2 22 01/15/2016 1648   ACIDBASEDEF 5.0* 01/15/2016 0812   O2SAT 92.0 01/15/2016 0812   CBG (last 3)   Recent Labs  01/16/16 0615 01/16/16 0927 01/16/16 1249  GLUCAP 101* 92 107*    Assessment/Plan: CABG using left internal mammary and saphenous vein  CV- s/p anterolateral STEMI due to left main dissection  still relatively low BP, continue coreg  Would benefit from ACE-I once BP can tolerate  ASA  Will check lipid panel to see if any indication for a statin  RESP- chest xray today shows decreased  right pneumo, increased left pneumo  No air leak- will dc CT  RENAL- creatinine OK  ENDO- CBG normal- dc checks  SCD + enoxaparin for DVT prophylaxis   LOS: 3 days    Loreli Slot 01/17/2016

## 2016-01-17 NOTE — Progress Notes (Signed)
X-ray called to notify of new pneumo, MD notified.  Hermina Barters, RN

## 2016-01-17 NOTE — Clinical Documentation Improvement (Addendum)
Cardiothoracic Surgery  (Query responses must be documented in the current medical record, not on the CDI BPA form.)  Query 1 of 2 "Anterolateral myocardial infarction" is documented in the current medical record.  Please document if the patient's ALMI was a:  - STEMI  - NSTEMI  - Other type of MI  - Unable to clinically determine   Query 2 of 2  "Right internal mammary artery" and Left internal mammary artery" are both documented in the current medical record as being used for the patient's coronary artery bypass grafting.  Please clarify the correct internal mammary artery was used.  Clinical Information: "Coronary artery bypass graft times using right internal mammary artery and left greater saphenous vein via endovein harvest. (Left)" documented by Dr. Dorris Fetch in progress note 01/15/16 at 9:48 am.   "Coronary artery bypass grafting x2 (left internal mammary artery to  left anterior descending, saphenous vein graft to obtuse marginal  1), and endoscopic vein harvest, left thigh" is documented in dictated operative report dated 01/14/16.   Please exercise your independent, professional judgment when responding. A specific answer is not anticipated or expected.   Thank You, Jerral Ralph  RN BSN [facility] [phone] Health Information Management [place]

## 2016-01-17 NOTE — Progress Notes (Signed)
Called report to Alison Stalling, RN on [facility], no questions from receiving RN, patient to be tx by Heide Guile, RN.  Hermina Barters, RN

## 2016-01-18 ENCOUNTER — Inpatient Hospital Stay (HOSPITAL_COMMUNITY): Payer: BLUE CROSS/BLUE SHIELD

## 2016-01-18 LAB — BASIC METABOLIC PANEL
Anion gap: 7 (ref 5–15)
BUN: 14 mg/dL (ref 6–20)
CALCIUM: 8.2 mg/dL — AB (ref 8.9–10.3)
CHLORIDE: 103 mmol/L (ref 101–111)
CO2: 27 mmol/L (ref 22–32)
CREATININE: 0.7 mg/dL (ref 0.44–1.00)
GFR calc Af Amer: >60 mL/min (ref >60)
GFR calc non Af Amer: >60 mL/min (ref >60)
Glucose, Bld: 93 mg/dL (ref 65–99)
Potassium: 3.8 mmol/L (ref 3.5–5.1)
SODIUM: 137 mmol/L (ref 135–145)

## 2016-01-18 LAB — CBC
HEMATOCRIT: 26 % — AB (ref 36.0–46.0)
HEMOGLOBIN: 8.3 g/dL — AB (ref 12.0–15.0)
MCH: 29.3 pg (ref 26.0–34.0)
MCHC: 31.9 g/dL (ref 30.0–36.0)
MCV: 91.9 fL (ref 78.0–100.0)
Platelets: 122 10*3/uL — ABNORMAL LOW (ref 150–400)
RBC: 2.83 MIL/uL — ABNORMAL LOW (ref 3.87–5.11)
RDW: 13.7 % (ref 11.5–15.5)
WBC: 7.9 10*3/uL (ref 4.0–10.5)

## 2016-01-18 LAB — TYPE AND SCREEN
ABO/RH(D): O POS
Antibody Screen: NEGATIVE
UNIT DIVISION: 0
UNIT DIVISION: 0
UNIT DIVISION: 0
Unit division: 0
Unit division: 0
Unit division: 0

## 2016-01-18 LAB — LIPID PANEL
Cholesterol: 60 mg/dL (ref 0–200)
HDL: 20 mg/dL — ABNORMAL LOW (ref >40)
LDL Cholesterol: 28 mg/dL (ref 0–99)
Total CHOL/HDL Ratio: 3 RATIO
Triglycerides: 59 mg/dL (ref <150)
VLDL: 12 mg/dL (ref 0–40)

## 2016-01-18 MED ORDER — LISINOPRIL 2.5 MG PO TABS
2.5000 mg | ORAL_TABLET | Freq: Every day | ORAL | Status: DC
  Administered 2016-01-18 – 2016-01-21 (×4): 2.5 mg via ORAL
  Filled 2016-01-18 (×4): qty 1

## 2016-01-18 MED ORDER — POTASSIUM CHLORIDE CRYS ER 20 MEQ PO TBCR
20.0000 meq | EXTENDED_RELEASE_TABLET | Freq: Once | ORAL | Status: AC
  Administered 2016-01-18: 20 meq via ORAL
  Filled 2016-01-18: qty 1

## 2016-01-18 NOTE — Progress Notes (Signed)
Utilization review completed.  

## 2016-01-18 NOTE — Progress Notes (Addendum)
[address]       [name] [address]             [phone]      4 Days Post-Op Procedure(s) (LRB): Coronary artery bypass graft times using right internal mammary artery and left greater saphenous vein via endovein harvest. (Left) Subjective: Feeling better  Objective: Vital signs in last 24 hours: Temp:  [98.5 F (36.9 C)-99.3 F (37.4 C)] 98.5 F (36.9 C) (01/24 0332) Pulse Rate:  [91-118] 114 (01/24 0332) Cardiac Rhythm:  [-] Sinus tachycardia (01/24 0700) Resp:  [14-24] 19 (01/24 0332) BP: (86-115)/(59-74) 115/73 mmHg (01/24 0332) SpO2:  [89 %-100 %] 92 % (01/24 0332) Weight:  [126 lb 14.4 oz (57.561 kg)] 126 lb 14.4 oz (57.561 kg) (01/24 0332)  Hemodynamic parameters for last 24 hours:    Intake/Output from previous day: 01/23 0701 - 01/24 0700 In: 120 [P.O.:120] Out: 450 [Urine:450] Intake/Output this shift:    General appearance: alert, cooperative and no distress Heart: regular rate and rhythm and tachy, + rub Lungs: dim left>right base Abdomen: benign Extremities: no sig edema Wound: incis healing well  Lab Results:  Recent Labs  01/17/16 0545 01/18/16 0323  WBC 10.1 7.9  HGB 8.4* 8.3*  HCT 25.4* 26.0*  PLT 106* 122*   BMET:  Recent Labs  01/17/16 0545 01/18/16 0323  NA 137 137  K 4.1 3.8  CL 104 103  CO2 27 27  GLUCOSE 93 93  BUN 11 14  CREATININE 0.66 0.70  CALCIUM 8.3* 8.2*    PT/INR: No results for input(s): LABPROT, INR in the last 72 hours. ABG    Component Value Date/Time   PHART 7.326* 01/15/2016 0812   HCO3 20.0 01/15/2016 0812   TCO2 22 01/15/2016 1648   ACIDBASEDEF 5.0* 01/15/2016 0812   O2SAT 92.0 01/15/2016 0812   CBG (last 3)   Recent Labs  01/16/16 0615 01/16/16 0927 01/16/16 1249  GLUCAP 101* 92 107*    Meds Scheduled Meds: . acetaminophen  1,000 mg Oral 4 times per day   Or  . acetaminophen (TYLENOL) oral liquid 160 mg/5 mL  1,000 mg Per Tube 4 times per day  . aspirin EC   325 mg Oral Daily   Or  . aspirin  324 mg Per Tube Daily  . carvedilol  3.125 mg Oral BID WC  . docusate sodium  200 mg Oral Daily  . enoxaparin (LOVENOX) injection  40 mg Subcutaneous Q24H  . moving right along book   Does not apply Once  . pantoprazole  40 mg Oral Daily  . simvastatin  20 mg Oral q1800  . sodium chloride  3 mL Intravenous Q12H   Continuous Infusions:  PRN Meds:.sodium chloride, ALPRAZolam, alum & mag hydroxide-simeth, magnesium hydroxide, ondansetron (ZOFRAN) IV, oxyCODONE, sodium chloride, traMADol, zolpidem  Xrays Dg Chest 2 View  01/18/2016  CLINICAL DATA:  Status post coronary bypass grafting. EXAM: CHEST  2 VIEW COMPARISON:  01/17/2016 FINDINGS: Cardiac shadow is stable. Left basilar infiltrate with associated small effusion is noted. Bilateral apical pneumothoraces are again seen and stable. Minimal right basilar atelectasis is noted as well. Postsurgical changes are again seen. No acute bony abnormality is noted. IMPRESSION: Stable bibasilar changes. Stable apical pneumothoraces. Electronically Signed   By: Alcide Clever M.D.   On: 01/18/2016 07:41   Dg Chest Port 1 View  01/17/2016  CLINICAL DATA:  Three days postop CABG.  Left chest tube removal. EXAM: PORTABLE CHEST  1 VIEW COMPARISON:  Portable chest x-ray earlier same date 0639 hr and previously. FINDINGS: Interval left chest tube removal with increase in size of the left apical pneumothorax, now on the order of 10% or so. New right apical pneumothorax, similar in size to that on the left after removal of the right subclavian introducer sheath and the mediastinal drainage tube. Dense consolidation in the left lower lobe and mild atelectasis involving the right lower lobe, unchanged since earlier in the day. Linear opacity at the left base related to the prior left chest tube tract. IMPRESSION: 1. Interval increase in size of the still small left apical pneumothorax after chest tube removal, now on the order of 10% or  so. 2. New small right apical pneumothorax, also on the order of 10% or so. 3. Stable dense left lower lobe atelectasis and mild right lower lobe atelectasis since earlier in the day. These results will be called to the ordering clinician or representative by the Radiologist Assistant, and communication documented in the PACS or zVision Dashboard. Electronically Signed   By: Hulan Saas M.D.   On: 01/17/2016 14:18   Dg Chest Port 1 View  01/17/2016  CLINICAL DATA:  CABG EXAM: PORTABLE CHEST 1 VIEW COMPARISON:  01/16/2016 FINDINGS: Changes of CABG. Left chest tube and mediastinal drain remain in place, unchanged. Left apical pneumothorax noted, new since prior study, likely 5-10%. Bibasilar atelectasis, improving on the right, not significantly changed on the left. Heart is normal size. IMPRESSION: New small left apical pneumothorax, 5-10%. Left chest tube remains in stable position. Bibasilar atelectasis, improving on the right. Electronically Signed   By: Charlett Nose M.D.   On: 01/17/2016 07:24    Assessment/Plan: S/P Procedure(s) (LRB): Coronary artery bypass graft times using right internal mammary artery and left greater saphenous vein via endovein harvest. (Left)  1 making steady progress, still fairly weak 2 tachy at times- ? increase coreg dose, BP is  limiting dosing somewhat, replace K+ to get >4.0 3 H/H stable, platelets improved 4 mod bilat pneumotx - stable 5 rehab/pulm toilet as able  LOS: 4 days    GOLD,WAYNE E 01/18/2016  Patient seen and examined, agree with above Given that she has had a major MI, I want to get her on an ACE-I- will start with low dose lisinopril Hold off on increasing coreg for now, she is young and can tolerate mild sinus tachy Cholesterol= 60!, dc simvastatin as atherosclerosis was not the underlying etiology in her case Bilateral pneumothoraces stable on CXR, recheck in AM She lives in [place], [place], Likely dc Thursday  Viviann Spare C. Dorris Fetch,  MD [facility] [phone]

## 2016-01-18 NOTE — Progress Notes (Signed)
CARDIAC REHAB PHASE I   PRE:  Rate/Rhythm: 101 ST  BP:  Supine: 89/63  Sitting: 102/67  Standing:    SaO2: 95%RA  MODE:  Ambulation: 350 ft   POST:  Rate/Rhythm: 133 ST  BP:  Supine:   Sitting: 114/79  Standing:    SaO2: 94%RA 0810-0843 Pt walked 350 ft on RA with rolling walker with slow pace. Stopped once to rest. Heart rate up with activity. To recliner with call bell. Tolerated well.   Luetta Nutting, RN BSN  01/18/2016 8:39 AM

## 2016-01-18 NOTE — Care Management Note (Addendum)
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 tx out of ICU on 01/17/16               Action/Plan: PTA pt lived at home- plan for pt to stay with her sister at discharge- NCM to follow for any d/c needs  Expected Discharge Date:                  Expected Discharge Plan:  Home/Self Care  In-House Referral:     Discharge planning Services  CM Consult  Post Acute Care Choice:    Choice offered to:     DME Arranged:    DME Agency:     HH Arranged:    HH Agency:     Status of Service:  In process, will continue to follow  Medicare Important Message Given:    Date Medicare IM Given:    Medicare IM give by:    Date Additional Medicare IM Given:    Additional Medicare Important Message give by:     If discussed at Long Length of Stay Meetings, dates discussed:    Additional Comments:  Darrold Span, RN 01/18/2016, 10:23 AM

## 2016-01-19 ENCOUNTER — Inpatient Hospital Stay (HOSPITAL_COMMUNITY): Payer: BLUE CROSS/BLUE SHIELD

## 2016-01-19 MED ORDER — POTASSIUM CHLORIDE CRYS ER 20 MEQ PO TBCR
20.0000 meq | EXTENDED_RELEASE_TABLET | Freq: Every day | ORAL | Status: DC
  Administered 2016-01-19 – 2016-01-20 (×2): 20 meq via ORAL
  Filled 2016-01-19 (×2): qty 1

## 2016-01-19 MED ORDER — ENOXAPARIN SODIUM 40 MG/0.4ML ~~LOC~~ SOLN
40.0000 mg | SUBCUTANEOUS | Status: DC
  Administered 2016-01-19 – 2016-01-20 (×2): 40 mg via SUBCUTANEOUS
  Filled 2016-01-19 (×2): qty 0.4

## 2016-01-19 MED ORDER — FUROSEMIDE 40 MG PO TABS
40.0000 mg | ORAL_TABLET | Freq: Every day | ORAL | Status: DC
  Administered 2016-01-19 – 2016-01-20 (×2): 40 mg via ORAL
  Filled 2016-01-19 (×2): qty 1

## 2016-01-19 NOTE — Progress Notes (Addendum)
      [address]       [name] [address]             [phone]        5 Days Post-Op Procedure(s) (LRB): Coronary artery bypass graft times using right internal mammary artery and left greater saphenous vein via endovein harvest. (Left)  Subjective: Patient without complaints  Objective: Vital signs in last 24 hours: Temp:  [98.6 F (37 C)-99.5 F (37.5 C)] 98.6 F (37 C) (01/25 0500) Pulse Rate:  [95-105] 102 (01/25 0500) Cardiac Rhythm:  [-] Sinus tachycardia (01/24 2128) Resp:  [18] 18 (01/25 0500) BP: (92-112)/(54-71) 112/71 mmHg (01/25 0500) SpO2:  [93 %-97 %] 93 % (01/25 0500) Weight:  [126 lb 15.8 oz (57.6 kg)] 126 lb 15.8 oz (57.6 kg) (01/25 0500)  Pre op weight 54 kg Current Weight  01/19/16 126 lb 15.8 oz (57.6 kg)      Intake/Output from previous day: 01/24 0701 - 01/25 0700 In: 360 [P.O.:360] Out: 1200 [Urine:1200]   Physical Exam:  Cardiovascular: RRR Pulmonary: Diminished at bases; no rales, wheezes, or rhonchi. Abdomen: Soft, non tender, bowel sounds present. Extremities: Mild bilateral lower extremity edema. Wounds: Clean and dry.  No erythema or signs of infection.  Lab Results: CBC: Recent Labs  01/17/16 0545 01/18/16 0323  WBC 10.1 7.9  HGB 8.4* 8.3*  HCT 25.4* 26.0*  PLT 106* 122*   BMET:  Recent Labs  01/17/16 0545 01/18/16 0323  NA 137 137  K 4.1 3.8  CL 104 103  CO2 27 27  GLUCOSE 93 93  BUN 11 14  CREATININE 0.66 0.70  CALCIUM 8.3* 8.2*    PT/INR:  Lab Results  Component Value Date   INR 1.80* 01/14/2016   ABG:  INR: Will add last result for INR, ABG once components are confirmed Will add last 4 CBG results once components are confirmed  Assessment/Plan:  1. CV - Slightly tachy. On Coreg 3.125 mg bid and Lisinopril 2.5 mg daily. 2.  Pulmonary - On room air. CXR shows persistent bilateral pneumothoraces and pleural effusions. Encourage incentive spirometer 3. Volume Overload - Will  give Lasix 40 mg daily 4.  Acute blood loss anemia - H and H stable at 8.3 and 26 5. Thrombocytopenia-last platelets up to 122,000 6. Remove EPW 7. Will discuss discharge disposition with Dr. Gae Gallop MPA-C 01/19/2016,7:57 AM  Patient seen and examined, agree with above She is progressing well. Plan home tomorrow I will talk with Dr. Kirke Corin and see if he has someone in mind for a Cardiologist  Viviann Spare C. Dorris Fetch, MD [facility] [phone]

## 2016-01-19 NOTE — Discharge Instructions (Signed)
Coronary Artery Bypass Grafting, Care After °Refer to this sheet in the next few weeks. These instructions provide you with information on caring for yourself after your procedure. Your health care provider may also give you more specific instructions. Your treatment has been planned according to current medical practices, but problems sometimes occur. Call your health care provider if you have any problems or questions after your procedure. °WHAT TO EXPECT AFTER THE PROCEDURE °Recovery from surgery will be different for everyone. Some people feel well after 3 or 4 weeks, while for others it takes longer. After your procedure, it is typical to have the following: °· Nausea and a lack of appetite.   °· Constipation. °· Weakness and fatigue.   °· Depression or irritability.   °· Pain or discomfort at your incision site. °HOME CARE INSTRUCTIONS °· Take medicines only as directed by your health care provider. Do not stop taking medicines or start any new medicines without first checking with your health care provider. °· Take your pulse as directed by your health care provider. °· Perform deep breathing as directed by your health care provider. If you were given a device called an incentive spirometer, use it to practice deep breathing several times a day. Support your chest with a pillow or your arms when you take deep breaths or cough. °· Keep incision areas clean, dry, and protected. Remove or change any bandages (dressings) only as directed by your health care provider. You may have skin adhesive strips over the incision areas. Do not take the strips off. They will fall off on their own. °· Check incision areas daily for any swelling, redness, or drainage. °· If incisions were made in your legs, do the following: °¨ Avoid crossing your legs.   °¨ Avoid sitting for long periods of time. Change positions every 30 minutes.   °¨ Elevate your legs when you are sitting. °· Wear compression stockings as directed by your  health care provider. These stockings help keep blood clots from forming in your legs. °· Take showers once your health care provider approves. Until then, only take sponge baths. Pat incisions dry. Do not rub incisions with a washcloth or towel. Do not take baths, swim, or use a hot tub until your health care provider approves. °· Eat foods that are high in fiber, such as raw fruits and vegetables, whole grains, beans, and nuts. Meats should be lean cut. Avoid canned, processed, and fried foods. °· Drink enough fluid to keep your urine clear or pale yellow. °· Weigh yourself every day. This helps identify if you are retaining fluid that may make your heart and lungs work harder. °· Rest and limit activity as directed by your health care provider. You may be instructed to: °¨ Stop any activity at once if you have chest pain, shortness of breath, irregular heartbeats, or dizziness. Get help right away if you have any of these symptoms. °¨ Move around frequently for short periods or take short walks as directed by your health care provider. Increase your activities gradually. You may need physical therapy or cardiac rehabilitation to help strengthen your muscles and build your endurance. °¨ Avoid lifting, pushing, or pulling anything heavier than 10 lb (4.5 kg) for at least 6 weeks after surgery. °· Do not drive until your health care provider approves.  °· Ask your health care provider when you may return to work. °· Ask your health care provider when you may resume sexual activity. °· Keep all follow-up visits as directed by your health care   provider. This is important. °SEEK MEDICAL CARE IF: °· You have swelling, redness, increasing pain, or drainage at the site of an incision. °· You have a fever. °· You have swelling in your ankles or legs. °· You have pain in your legs.   °· You gain 2 or more pounds (0.9 kg) a day. °· You are nauseous or vomit. °· You have diarrhea.  °SEEK IMMEDIATE MEDICAL CARE IF: °· You have  chest pain that goes to your jaw or arms. °· You have shortness of breath.   °· You have a fast or irregular heartbeat.   °· You notice a "clicking" in your breastbone (sternum) when you move.   °· You have numbness or weakness in your arms or legs. °· You feel dizzy or light-headed.   °MAKE SURE YOU: °· Understand these instructions. °· Will watch your condition. °· Will get help right away if you are not doing well or get worse. °  °This information is not intended to replace advice given to you by your health care provider. Make sure you discuss any questions you have with your health care provider. °  °Document Released: 06/30/2005 Document Revised: 01/01/2015 Document Reviewed: 05/20/2013 °Elsevier Interactive Patient Education ©2016 [employer] ° °

## 2016-01-19 NOTE — Discharge Summary (Signed)
Physician Discharge Summary       [address] [place]       [name] [address]             [phone]    Patient ID: Karina Flynn MRN: [id number] DOB/AGE: 1981-05-10 34 y.o.  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 Kirke Corin on 01/14/2016.  IABP Insertion   Coronary Angiogram    Conclusion     LM lesion, 95% stenosed.  Ost LAD lesion, 99% stenosed.  Prox Cx lesion, 100% stenosed.  1. 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 No significant disease affecting the right coronary artery. 2. Left ventricular angiography was not performed as the patient decompensated. 3. Successful intra-aortic balloon pump placement.  Recommendations: Emergent CABG. The patient only received aspirin and 3000 units of unfractionated heparin. She did not receive any other antiplatelet medication. I left a message to the patient's sister, [name] at [phone].    1. Median sternotomy extracorporeal circulation. 2. Coronary artery bypass grafting x2 (left internal mammary artery to left anterior descending, saphenous vein graft to obtuse marginal 1), and endoscopic vein harvest, left thigh by Dr. Dorris Fetch on 01/14/2016.  History of Presenting Illness: This is a 35 year old Caucasian female with no previous cardiac history. She has no chronic medical conditions and she does not take any medications. She is not a smoker and has no family history of coronary artery disease. She has been sexually inactive and reports that there is no possibility of her being pregnant. She presented with sudden onset of chest pain this morning. She is visiting town from [place]. The chest pain was sudden after she took a shower and it was substernal with radiating to her back. It was  associated with significant fatigue, shortness of breath and dizziness. There was no syncope. She came to the emergency room where she was found to have mildly elevated troponin. EKG showed minor global ST elevation. The pattern was suggestive of pericarditis. The patient however denied any symptoms of upper respiratory tract infection. We decided to do CTA of the chest which showed no evidence of aortic dissection. The second troponin came back elevated at 8 and thus emergent cardiac catheterization was recommended. Patient was found to have a left main dissection noted. IABP placed with improvement in chest pain. She was brought directly to OR at [facility]. She was still having mild CP on arrival. Dr. Dorris Fetch discussed the need for emergent CABG. Potential risks, benefits, and complications of the surgery were discussed with patient and she agreed to proceed with surgery. She underwent an emergent CABG x 2 on 01/14/2016.  Brief Hospital Course:  The patient was extubated the morning of post op day one without difficulty. She remained afebrile and hemodynamically stable. She was weaned off of Neo Synephrine drip. Theone Murdoch, a line, and foley were removed early in the post operative course. IABP was removed on 01/21. Chest tubes remained for a few days then were removed. Coreg was started and titrated accordingly. She was volume over loaded and diuresed. She had ABL anemia. She did not require a post op transfusion. Her last H and H was 8.3 and 26. She had mild thrombocytopenia and her last platelet count was up to 122,000. She was weaned off the insulin drip. The patient's glucose remained well controlled.  The patient was  felt surgically stable for transfer from the ICU to [facility] for further convalescence on 01/17/2015. Her chest x ray showed bilateral pneumothoraces that remained stable.  She had some issues with Tachycardia and her beta blocker was increased as tolerated. She was ambulating on room air.  She  continues to progress with cardiac rehab.She has been tolerating a diet and has had a bowel movement. Epicardial pacing wires will be removed today. Chest tube sutures will be removed prior to discharge. The patient is felt surgically stable for discharge today.  Latest Vital Signs: Blood pressure 100/65, pulse 92, temperature 98.6 F (37 C), temperature source Oral, resp. rate 16, height  (1.727 m), weight 115 lb 14.4 oz (52.572 kg), last menstrual period 01/19/2016, SpO2 97 %.  Physical Exam: Cardiovascular: RRR Pulmonary: Diminished at bases; no rales, wheezes, or rhonchi. Abdomen: Soft, non tender, bowel sounds present. Extremities: Mild bilateral lower extremity edema. Wounds: Clean and dry. No erythema or signs of infection.  Discharge Condition:Stable and discharged to home  Recent laboratory studies:  Lab Results  Component Value Date   WBC 7.9 01/18/2016   HGB 8.3* 01/18/2016   HCT 26.0* 01/18/2016   MCV 91.9 01/18/2016   PLT 122* 01/18/2016   Lab Results  Component Value Date   NA 137 01/18/2016   K 3.8 01/18/2016   CL 103 01/18/2016   CO2 27 01/18/2016   CREATININE 0.70 01/18/2016   GLUCOSE 93 01/18/2016    Diagnostic Studies: Dg Chest 2 View  01/19/2016  CLINICAL DATA:  Sore chest, followup pneumothorax, history cardiac surgery EXAM: CHEST  2 VIEW COMPARISON:  01/18/2016 FINDINGS: Epicardial pacing wires identified. Normal heart size post CABG. Stable mediastinal contours. Biapical pneumothoraces again identified greater on RIGHT, not significantly changed. Bibasilar atelectasis and small pleural effusions. No acute osseous findings IMPRESSION: Persistent BILATERAL hydropneumothoraces greater on RIGHT. Pneumothorax components appear stable since previous exam while the basilar pleural effusions have slightly increased. Electronically Signed   By: Ulyses Southward M.D.   On: 01/2 Dg Chest Port 1 View    Ct Angio Chest Aorta W/cm &/or Wo/cm  01/14/2016  CLINICAL  DATA:  Chest pain extending through to the back. EXAM: CT ANGIOGRAPHY CHEST WITH CONTRAST TECHNIQUE: Multidetector CT imaging of the chest was performed using the standard protocol during bolus administration of intravenous contrast. Multiplanar CT image reconstructions and MIPs were obtained to evaluate the vascular anatomy. CONTRAST:  75mL OMNIPAQUE IOHEXOL 350 MG/ML SOLN COMPARISON:  Two-view chest x-ray 01/14/2016. FINDINGS: There is excellent opacification of the thoracic aorta. There is no aneurysm or dissection. No focal aortic L abnormality is present. The great vessel origins are within normal limits. A 3 vessel arch configuration is present. Pulmonary arteries are normally opacified without evidence for pulmonary embolus. The heart size is normal. No significant pleural or pericardial effusion is present. The lungs are clear without focal nodule, mass, or airspace disease. The bone windows are unremarkable. Review of the MIP images confirms the above findings. IMPRESSION: Negative CTA of the chest. Electronically Signed   By: Marin Roberts M.D.   On: 01/14/2016 15:27       Discharge Instructions    Amb Referral to Cardiac Rehabilitation    Complete by:  As directed   Diagnosis:  CABG Comment - [facility] to [facility]          Discharge Medications:   Medication List    TAKE these medications        aspirin 325 MG EC tablet  Take 1 tablet (325 mg total) by mouth daily.     carvedilol 12.5 MG tablet  Commonly known as:  COREG  Take 1 tablet (12.5 mg total) by mouth 2 (two) times daily with a meal.     HAIR/SKIN/NAILS PO  Take 1 tablet by mouth daily.     ibuprofen 200 MG tablet  Commonly known as:  ADVIL,MOTRIN  Take 200 mg by mouth every 6 (six) hours as needed for headache.     lisinopril 2.5 MG tablet  Commonly known as:  PRINIVIL,ZESTRIL  Take 1 tablet (2.5 mg total) by mouth daily.     oxyCODONE 5 MG immediate release tablet  Commonly known as:  Oxy  IR/ROXICODONE  Take 1-2 tablets (5-10 mg total) by mouth every 3 (three) hours as needed for severe pain.     traMADol 50 MG tablet  Commonly known as:  ULTRAM  Take 1-2 tablets (50-100 mg total) by mouth every 4 (four) hours as needed for moderate pain.       The patient has been discharged on:   1.Beta Blocker:  Yes [ x  ]                              No   [   ]                              If No, reason:  2.Ace Inhibitor/ARB: Yes [  x ]                                     No  [    ]                                     If No, reason:  3.Statin:   Yes [   ]                  No  [  x ]                  If No, reason: No CAD  4.Marlowe Kays:  Yes  [ x  ]                  No   [   ]                  If No, reason: Follow Up Appointments: Follow-up Information    Follow up with Lorine Bears, MD.   Specialty:  Cardiology   Why:  Call for a follow up appointment for 2 weeks   Contact information:   [address] [place] [address] [phone]       Follow up with Loreli Slot, MD On 02/15/2016.   Specialty:  Cardiothoracic Surgery   Why:  PA/LAT CXR to be taken (at [facility] which is in the same building as Dr. Sunday Corn office) on 02/15/2016 at 10:00 am;Appointment  time is at 10:45 am   Contact information:   [address] [place] [address] [phone]       Signed: Marrion Coy 01/21/2016, 9:02 AM

## 2016-01-19 NOTE — Progress Notes (Signed)
CARDIAC REHAB PHASE I   PRE:  Rate/Rhythm: 108 ST  BP:  Supine: 105/63  Sitting:   Standing:    SaO2: 99% 2L  MODE:  Ambulation: 550 ft   POST:  Rate/Rhythm: 128 ST  BP:  Supine:   Sitting: 117/77  Standing:    SaO2: 93%RA 0922-1026 Pt walked 550 ft on RA with hand held asst. Gait slow and steady. Stopped once to rest. Heart rate up with activity. To recliner after walk. Education completed. Mother and sister with some questions re activity. Encouraged IS, wrote out ex ed. Discussed CRP 2 and will refer to [facility]. Put on discharge video for pt and family to view.    Luetta Nutting, RN BSN  01/19/2016 10:22 AM

## 2016-01-19 NOTE — Progress Notes (Signed)
Pacer wires were pulled. Left one came out without any problem. The right one is stuck. The charge nurse attempted to pull as well. The PA was called. Vitals signs are stable. Will continue to monitor.

## 2016-01-19 NOTE — Progress Notes (Signed)
Doree Fudge, PA was able to pull the last pacer wire. Patient was instructed to remain in bed for a hour. Vital signs remained stable. Will continue to monitor

## 2016-01-19 NOTE — Progress Notes (Signed)
Patient ambulated 352ft on room air with family present. Patient tolerated well. RN will continue to monitor patient

## 2016-01-20 MED ORDER — CARVEDILOL 12.5 MG PO TABS
12.5000 mg | ORAL_TABLET | Freq: Two times a day (BID) | ORAL | Status: DC
  Administered 2016-01-20 – 2016-01-21 (×2): 12.5 mg via ORAL
  Filled 2016-01-20: qty 4
  Filled 2016-01-20: qty 1

## 2016-01-20 MED ORDER — CARVEDILOL 6.25 MG PO TABS
6.2500 mg | ORAL_TABLET | Freq: Two times a day (BID) | ORAL | Status: DC
  Administered 2016-01-20: 6.25 mg via ORAL
  Filled 2016-01-20: qty 1

## 2016-01-20 NOTE — Progress Notes (Signed)
Patient ambulated >500 feet and tolerated it well. 

## 2016-01-20 NOTE — Progress Notes (Signed)
  CAR DIAC RE73HAB PHASE I   PRE:  Rate/Rhythm: 105 ST  BP:  Supine: 108/  Sitting:   Standing:    SaO2: 97%RA  MODE:  Ambulation: 690 ft   POST:  Rate/Rhythm: 135 ST  BP:  Supine:   Sitting: 111/84  Standing:    SaO2: 99%RA 1032-1105 Pt walked 690 ft on RA with hand held asst with steady gait. Pace faster today. Heart rate up with activity but down with rest.   Luetta Nutting, RN BSN  01/20/2016 11:02 AM

## 2016-01-20 NOTE — Progress Notes (Addendum)
[address]       [place] [address]             [phone]      6 Days Post-Op Procedure(s) (LRB): Coronary artery bypass graft times using right internal mammary artery and left greater saphenous vein via endovein harvest. (Left) Subjective: Feels fair overall  Objective: Vital signs in last 24 hours: Temp:  [98.8 F (37.1 C)-99.9 F (37.7 C)] 99.9 F (37.7 C) (01/26 0524) Pulse Rate:  [89-116] 116 (01/26 0524) Cardiac Rhythm:  [-] Normal sinus rhythm (01/25 1958) Resp:  [18-20] 20 (01/26 0524) BP: (93-117)/(57-74) 117/74 mmHg (01/26 0524) SpO2:  [99 %-100 %] 100 % (01/26 0524) Weight:  [121 lb 4.1 oz (55 kg)] 121 lb 4.1 oz (55 kg) (01/26 0519)  Hemodynamic parameters for last 24 hours:    Intake/Output from previous day: 01/25 0701 - 01/26 0700 In: 360 [P.O.:360] Out: 2900 [Urine:2900] Intake/Output this shift:    General appearance: alert, cooperative and no distress Heart: regular rate and rhythm and tachy Lungs: clear to auscultation bilaterally Abdomen: benign Extremities: minor edema Wound: incis healing well  Lab Results:  Recent Labs  01/18/16 0323  WBC 7.9  HGB 8.3*  HCT 26.0*  PLT 122*   BMET:  Recent Labs  01/18/16 0323  NA 137  K 3.8  CL 103  CO2 27  GLUCOSE 93  BUN 14  CREATININE 0.70  CALCIUM 8.2*    PT/INR: No results for input(s): LABPROT, INR in the last 72 hours. ABG    Component Value Date/Time   PHART 7.326* 01/15/2016 0812   HCO3 20.0 01/15/2016 0812   TCO2 22 01/15/2016 1648   ACIDBASEDEF 5.0* 01/15/2016 0812   O2SAT 92.0 01/15/2016 0812   CBG (last 3)  No results for input(s): GLUCAP in the last 72 hours.  Meds Scheduled Meds: . aspirin EC  325 mg Oral Daily   Or  . aspirin  324 mg Per Tube Daily  . carvedilol  3.125 mg Oral BID WC  . docusate sodium  200 mg Oral Daily  . enoxaparin (LOVENOX) injection  40 mg Subcutaneous Q24H  . furosemide  40 mg Oral Daily  . lisinopril  2.5 mg  Oral Daily  . moving right along book   Does not apply Once  . pantoprazole  40 mg Oral Daily  . potassium chloride  20 mEq Oral Daily  . sodium chloride  3 mL Intravenous Q12H   Continuous Infusions:  PRN Meds:.sodium chloride, ALPRAZolam, alum & mag hydroxide-simeth, magnesium hydroxide, ondansetron (ZOFRAN) IV, oxyCODONE, sodium chloride, traMADol, zolpidem  Xrays Dg Chest 2 View  01/19/2016  CLINICAL DATA:  Sore chest, followup pneumothorax, history cardiac surgery EXAM: CHEST  2 VIEW COMPARISON:  01/18/2016 FINDINGS: Epicardial pacing wires identified. Normal heart size post CABG. Stable mediastinal contours. Biapical pneumothoraces again identified greater on RIGHT, not significantly changed. Bibasilar atelectasis and small pleural effusions. No acute osseous findings IMPRESSION: Persistent BILATERAL hydropneumothoraces greater on RIGHT. Pneumothorax components appear stable since previous exam while the basilar pleural effusions have slightly increased. Electronically Signed   By: Ulyses Southward M.D.   On: 01/19/2016 07:43    Assessment/Plan:   1 doing well 2 HR - tachy to >140 at times- increase coreg, BP in low 90's at times, will see how she handles 6.25 BID 3 cont to diurese 4 cont rehab/routine pulm toilet   LOS: 6 days    GOLD,WAYNE E 01/20/2016  Patient  seen and examined. HR still up, she is tolerating it well. Will increase Coreg to 6.25 and check to make sure BP ok Likely home later today Will try to arrange follow up with Dr. Henry Russel at [facility] who Dr. Kirke Corin recommended  Salvatore Decent. Dorris Fetch, MD [facility] [phone]

## 2016-01-21 MED ORDER — ONDANSETRON HCL 4 MG PO TABS: 4.0000 mg | ORAL_TABLET | Freq: Once | ORAL | Status: DC

## 2016-01-21 MED ORDER — TRAMADOL HCL 50 MG PO TABS: 50.0000 mg | ORAL_TABLET | ORAL | Status: DC | PRN

## 2016-01-21 MED ORDER — ONDANSETRON HCL 4 MG PO TABS: 4.0000 mg | ORAL_TABLET | Freq: Three times a day (TID) | ORAL | Status: DC | PRN

## 2016-01-21 MED ORDER — CARVEDILOL 12.5 MG PO TABS: 12.5000 mg | ORAL_TABLET | Freq: Two times a day (BID) | ORAL | Status: DC

## 2016-01-21 MED ORDER — ASPIRIN 325 MG PO TBEC: 325.0000 mg | DELAYED_RELEASE_TABLET | Freq: Every day | ORAL | Status: DC

## 2016-01-21 MED ORDER — LISINOPRIL 2.5 MG PO TABS: 2.5000 mg | ORAL_TABLET | Freq: Every day | ORAL | Status: DC

## 2016-01-21 MED ORDER — OXYCODONE HCL 5 MG PO TABS: 5.0000 mg | ORAL_TABLET | ORAL | Status: DC | PRN

## 2016-01-21 MED FILL — CARVEDILOL 12.5 MG TABLET: 12.5 | 30 days supply | Qty: 60 | Fill #0

## 2016-01-21 MED FILL — traMADol HCL 50 MG TABS: 50 | 3 days supply | Qty: 30 | Fill #0

## 2016-01-21 MED FILL — ONDANSETRON HCL 4 MG TABLET: 4 | 7 days supply | Qty: 20 | Fill #0

## 2016-01-21 MED FILL — oxyCODONE HCL 5 MG TABS: 5 | 2 days supply | Qty: 30 | Fill #0

## 2016-01-21 MED FILL — LISINOPRIL 2.5 MG TABLET: 2.5 | 30 days supply | Qty: 30 | Fill #0

## 2016-01-21 NOTE — Progress Notes (Signed)
Discussed with the patient, her mom, and her sister and all questioned fully answered. She will call me if any problems arise.  Leonidas Romberg, RN

## 2016-01-21 NOTE — Progress Notes (Signed)
      [address]       [place] [address]             [phone]      7 Days Post-Op Procedure(s) (LRB): Coronary artery bypass graft times using right internal mammary artery and left greater saphenous vein via endovein harvest. (Left)   Subjective:  Ms. Menard has no complaints.  She states she is feeling much better this morning.  + ambulation  + BM  Objective: Vital signs in last 24 hours: Temp:  [98.1 F (36.7 C)-99.3 F (37.4 C)] 98.6 F (37 C) (01/27 0556) Pulse Rate:  [92-113] 92 (01/27 0556) Cardiac Rhythm:  [-] Normal sinus rhythm (01/27 0700) Resp:  [16-18] 16 (01/27 0556) BP: (100-111)/(61-79) 100/65 mmHg (01/27 0556) SpO2:  [97 %-100 %] 97 % (01/27 0556) Weight:  [115 lb 14.4 oz (52.572 kg)] 115 lb 14.4 oz (52.572 kg) (01/27 0556)  Intake/Output from previous day: 01/26 0701 - 01/27 0700 In: 320 [P.O.:320] Out: 2400 [Urine:2400]  General appearance: alert, cooperative and no distress Heart: regular rate and rhythm Lungs: clear to auscultation bilaterally Abdomen: soft, non-tender; bowel sounds normal; no masses,  no organomegaly Extremities: edema none appreciated Wound: clean and dry  Lab Results: No results for input(s): WBC, HGB, HCT, PLT in the last 72 hours. BMET: No results for input(s): NA, K, CL, CO2, GLUCOSE, BUN, CREATININE, CALCIUM in the last 72 hours.  PT/INR: No results for input(s): LABPROT, INR in the last 72 hours. ABG    Component Value Date/Time   PHART 7.326* 01/15/2016 0812   HCO3 20.0 01/15/2016 0812   TCO2 22 01/15/2016 1648   ACIDBASEDEF 5.0* 01/15/2016 0812   O2SAT 92.0 01/15/2016 0812   CBG (last 3)  No results for input(s): GLUCAP in the last 72 hours.  Assessment/Plan: S/P Procedure(s) (LRB): Coronary artery bypass graft times using right internal mammary artery and left greater saphenous vein via endovein harvest. (Left)  1. CV- Sinus Tach, rate improved- continue Coreg at 6.25 2. Pulm-no acute  issues, continue IS 3. Renal- weight is below baseline, d/c Lasix and potassium 4. Dispo- patient is stable, will d/c home today  LOS: 7 days    Raford Pitcher, Griselda Bramblett 01/21/2016

## 2016-01-21 NOTE — Care Management Note (Signed)
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 tx out of ICU on 01/17/16               Action/Plan: PTA pt lived at home- plan for pt to stay with her sister at discharge- NCM to follow for any d/c needs  Expected Discharge Date:    01/21/16              Expected Discharge Plan:  Home/Self Care  In-House Referral:     Discharge planning Services  CM Consult  Post Acute Care Choice:    Choice offered to:     DME Arranged:    DME Agency:     HH Arranged:    HH Agency:     Status of Service:  Completed, signed off  Medicare Important Message Given:    Date Medicare IM Given:    Medicare IM give by:    Date Additional Medicare IM Given:    Additional Medicare Important Message give by:     If discussed at Long Length of Stay Meetings, dates discussed:    Discharge Disposition: Home/self care   Additional Comments:   01/20/16- requested by bedside RN that pt wanted to speak with someone regarding insurance- spoke with pt, sister and mother at bedside, questions answered regarding how billing works and what to do if they have any questions when they get their statement once hospital stay has been billed to insurance and then pt gets final bill from hospital. No anticipated needs noted for discharge, pt plans to stay with sister for awhile.   Darrold Span, RN 01/21/2016, 10:02 AM

## 2016-02-11 ENCOUNTER — Other Ambulatory Visit: Payer: Self-pay | Admitting: Thoracic Surgery (Cardiothoracic Vascular Surgery)

## 2016-02-11 DIAGNOSIS — Z951 Presence of aortocoronary bypass graft: Secondary | ICD-10-CM

## 2016-02-15 ENCOUNTER — Encounter: Payer: Self-pay | Admitting: Cardiovascular Disease

## 2016-02-15 ENCOUNTER — Ambulatory Visit
Admission: RE | Admit: 2016-02-15 | Discharge: 2016-02-15 | Disposition: A | Discharge status: Home / Self Care | Payer: BLUE CROSS/BLUE SHIELD | Source: Ambulatory Visit | Attending: Thoracic Surgery (Cardiothoracic Vascular Surgery) | Admitting: Thoracic Surgery (Cardiothoracic Vascular Surgery)

## 2016-02-15 ENCOUNTER — Ambulatory Visit (INDEPENDENT_AMBULATORY_CARE_PROVIDER_SITE_OTHER): Payer: Self-pay | Admitting: Thoracic Surgery (Cardiothoracic Vascular Surgery)

## 2016-02-15 ENCOUNTER — Encounter: Payer: Self-pay | Admitting: Thoracic Surgery (Cardiothoracic Vascular Surgery)

## 2016-02-15 ENCOUNTER — Ambulatory Visit (INDEPENDENT_AMBULATORY_CARE_PROVIDER_SITE_OTHER): Payer: BLUE CROSS/BLUE SHIELD | Admitting: Cardiovascular Disease

## 2016-02-15 VITALS — BP 93/64 | HR 80 | Resp 16 | Ht 68.0 in | Wt 115.0 lb

## 2016-02-15 VITALS — BP 88/60 | HR 85 | Ht 67.0 in | Wt 120.0 lb

## 2016-02-15 DIAGNOSIS — I2542 Coronary artery dissection: Secondary | ICD-10-CM

## 2016-02-15 DIAGNOSIS — Z951 Presence of aortocoronary bypass graft: Secondary | ICD-10-CM

## 2016-02-15 DIAGNOSIS — I255 Ischemic cardiomyopathy: Secondary | ICD-10-CM | POA: Diagnosis not present

## 2016-02-15 MED ORDER — CARVEDILOL 12.5 MG PO TABS: 12.5000 mg | ORAL_TABLET | Freq: Two times a day (BID) | ORAL | Status: DC

## 2016-02-15 MED ORDER — LISINOPRIL 2.5 MG PO TABS: 2.5000 mg | ORAL_TABLET | Freq: Every day | ORAL | Status: DC

## 2016-02-15 NOTE — Progress Notes (Signed)
HPI  Karina Flynn is a pleasant 35 year old female who is here today for follow-up visit. She presented on January 20 with non-ST elevation myocardial infarction. She underwent urgent cardiac catheterization which showed continuous dissection starting at the distal left main into both LAD and left circumflex. The left circumflex complex was completely occluded. The right coronary artery was normal. Her condition worsened during cardiac catheterization and thus an intra-aortic balloon pump was placed and she was transferred to [facility] where she underwent emergent CABG with LIMA to LAD and SVG to OM. Intraoperative TEE showed an ejection fraction of 40% with akinesis of the anterior and lateral myocardium. Postoperative course was unremarkable except for sinus tachycardia which was well-controlled with carvedilol. She has been doing extremely well since hospital discharge with no recurrent anginal symptoms. There is no family history of spontaneous coronary dissection. The patient was completely healthy before her cardiac event.  Allergies  Allergen Reactions  . Ceclor [Cefaclor] Rash     Current Outpatient Prescriptions on File Prior to Visit  Medication Sig Dispense Refill  . aspirin EC 325 MG EC tablet Take 1 tablet (325 mg total) by mouth daily. 30 tablet 0  . carvedilol (COREG) 12.5 MG tablet Take 1 tablet (12.5 mg total) by mouth 2 (two) times daily with a meal. 60 tablet 3  . ibuprofen (ADVIL,MOTRIN) 200 MG tablet Take 200 mg by mouth every 6 (six) hours as needed for headache.    . lisinopril (PRINIVIL,ZESTRIL) 2.5 MG tablet Take 1 tablet (2.5 mg total) by mouth daily. 30 tablet 3  . Multiple Vitamins-Minerals (HAIR/SKIN/NAILS PO) Take 1 tablet by mouth daily.    . traMADol (ULTRAM) 50 MG tablet Take 1-2 tablets (50-100 mg total) by mouth every 4 (four) hours as needed for moderate pain. 30 tablet 0   No current facility-administered medications on file prior to visit.     History reviewed.  No pertinent past medical history.   Past Surgical History  Procedure Laterality Date  . Inner ear surgery    . Cardiac catheterization N/A 01/14/2016    Procedure: Coronary Angiogram;  Surgeon: Iran Ouch, MD;  Location: [facility] INVASIVE CV LAB;  Service: Cardiovascular;  Laterality: N/A;  . Cardiac catheterization N/A 01/14/2016    Procedure: IABP Insertion;  Surgeon: Iran Ouch, MD;  Location: [facility] INVASIVE CV LAB;  Service: Cardiovascular;  Laterality: N/A;  . Coronary artery bypass graft Left 01/14/2016    Procedure: Coronary artery bypass graft times using right internal mammary artery and left greater saphenous vein via endovein harvest.;  Surgeon: Loreli Slot, MD;  Location: [facility] OR;  Service: Open Heart Surgery;  Laterality: Left;     Family History  Problem Relation Age of Onset  . Family history unknown: Yes     Social History   Social History  . Marital Status: Single    Spouse Name: N/A  . Number of Children: N/A  . Years of Education: N/A   Occupational History  . Not on file.   Social History Main Topics  . Smoking status: Never Smoker   . Smokeless tobacco: Never Used  . Alcohol Use: No  . Drug Use: No  . Sexual Activity: Not on file   Other Topics Concern  . Not on file   Social History Narrative      PHYSICAL EXAM   BP 88/60 mmHg  Pulse 85  Ht  (1.702 m)  Wt 120 lb (54.432 kg)  BMI 18.79 kg/m2  LMP 01/25/2016 (  Approximate) Constitutional: She is oriented to person, place, and time. She appears well-developed and well-nourished. No distress.  HENT: No nasal discharge.  Head: Normocephalic and atraumatic.  Eyes: Pupils are equal and round. No discharge.  Neck: Normal range of motion. Neck supple. No JVD present. No thyromegaly present.  Cardiovascular: Normal rate, regular rhythm, normal heart sounds. Exam reveals no gallop and no friction rub. No murmur heard. Well-healed surgical scar Pulmonary/Chest: Effort normal and  breath sounds normal. No stridor. No respiratory distress. She has no wheezes. She has no rales. She exhibits no tenderness.  Abdominal: Soft. Bowel sounds are normal. She exhibits no distension. There is no tenderness. There is no rebound and no guarding.  Musculoskeletal: Normal range of motion. She exhibits no edema and no tenderness.  Neurological: She is alert and oriented to person, place, and time. Coordination normal.  Skin: Skin is warm and dry. No rash noted. She is not diaphoretic. No erythema. No pallor.  Psychiatric: She has a normal mood and affect. Her behavior is normal. Judgment and thought content normal.  Normal radial pulses bilaterally. Normal femoral pulses.   EKG: Normal sinus rhythm with evidence of prior lateral infarct.   ASSESSMENT AND PLAN

## 2016-02-15 NOTE — Patient Instructions (Signed)
Medication Instructions: Continue same medications.   Labwork: None.   Procedures/Testing: None. Recommend an echocardiogram in 2 months from now.   Follow-Up: As needed. Schedule a follow up appointment with Dr. Dr. Henry Russel within 1 month  Any Additional Special Instructions Will Be Listed Below (If Applicable).  You can start cardiac rehab.   For more information you can check: [email or web address]     If you need a refill on your cardiac medications before your next appointment, please call your pharmacy.

## 2016-02-15 NOTE — Assessment & Plan Note (Signed)
Continue treatment with current dose of carvedilol and lisinopril. She is mildly hypotensive but completely asymptomatic. Recommend repeating echocardiogram in 2 months from now. She has no signs of fluid overload.

## 2016-02-15 NOTE — Progress Notes (Signed)
      [address]       [name] [address]             [phone]        HPI:  Ms. Nolting returns today for scheduled postoperative follow-up visit.  She is a 35 year old woman who presented to [facility] on 01/14/2016 with sudden onset of chest pain. She was ultimately found to have an acute MI with a left main coronary dissection. She was transported emergently to [facility] and underwent emergent coronary bypass grafting 2.  Postoperatively she did well without any major complications. She was discharged on 01/20/2016.  Since discharge she's been doing well. She has some mild incisional pain. She was taking tramadol at night before she went to sleep, but she has not had to do that lately. She is not taking any pain medication during the day. She has not had any recurrent anginal pain. She denies shortness of breath.    Current Outpatient Prescriptions  Medication Sig Dispense Refill  . aspirin EC 325 MG EC tablet Take 1 tablet (325 mg total) by mouth daily. 30 tablet 0  . carvedilol (COREG) 12.5 MG tablet Take 1 tablet (12.5 mg total) by mouth 2 (two) times daily with a meal. 60 tablet 3  . ibuprofen (ADVIL,MOTRIN) 200 MG tablet Take 200 mg by mouth every 6 (six) hours as needed for headache.    . lisinopril (PRINIVIL,ZESTRIL) 2.5 MG tablet Take 1 tablet (2.5 mg total) by mouth daily. 30 tablet 3  . Multiple Vitamins-Minerals (HAIR/SKIN/NAILS PO) Take 1 tablet by mouth daily.    . traMADol (ULTRAM) 50 MG tablet Take 1-2 tablets (50-100 mg total) by mouth every 4 (four) hours as needed for moderate pain. 30 tablet 0   No current facility-administered medications for this visit.    Physical Exam BP 93/64 mmHg  Pulse 80  Resp 16  Ht  (1.727 m)  Wt 115 lb (52.164 kg)  BMI 17.49 kg/m2  SpO2 99%  LMP 01/25/2016 (Approximate) 35 year old woman in no acute distress Well-developed and well-nourished Alert and oriented 3 with no focal deficits Cardiac  regular rate and rhythm normal S1 and S2 no rubs or murmurs Lungs clear with aggressive bilaterally  Sternum stable, incision healing well Leg incisions healing well, no peripheral edema  Diagnostic Tests: I personally reviewed her chest x-ray. It shows possible slight elevation of left hemidiaphragm and a small left pleural effusion.  Impression: 35 year old woman who underwent emergent coronary bypass grafting 2 for a spontaneous dissection of left main coronary artery on 01/14/2016. She is now about a month out from surgery and is doing extremely well.  She requested refills of her Coreg and lisinopril. [facility] [place].  She may begin driving. Appropriate precautions were discussed. She should not lift anything over 10 pounds for another 2 weeks. After that her activities are unrestricted, but she was cautioned to build into new activities gradually.  She will see Dr. Kirke Corin this afternoon on her way home.  She will follow-up with a cardiologist at [facility] long-term  Plan: I will be happy to see her back any time the future if I can be of any further assistance with her care.  Loreli Slot, MD [facility] [phone]

## 2016-02-15 NOTE — Assessment & Plan Note (Signed)
She is status post 2 vessel CABG as outlined above and she is doing extremely well. She had spontaneous distal left main coronary dissection with no prior history. There is no indication for treatment with a statin. Continue aspirin daily. She can start cardiac rehabilitation. I explained to her that she should pregnancy especially that there is no long-term data about the risk of recurrent spontaneous coronary dissection. She wants to start working from home and she can start that in one month. She is going to start cardiac rehabilitation in [place] close to where she lives. She is going to establish cardiology follow-up with Dr.Alhosaini at [facility].

## 2016-02-21 ENCOUNTER — Telehealth: Payer: Self-pay

## 2016-02-21 NOTE — Telephone Encounter (Signed)
Pt sister would like a call regarding pt cardiac reahab . Please call.

## 2016-02-21 NOTE — Telephone Encounter (Signed)
S/w pt sister, Dorene Grebe, who requests all records be faxed to Dr. Renne Crigler, Attn: [place], in [place], [place].  Fax: [phone] Pt has appt tomorrow 3:30pm  Records faxed as requested.

## 2016-02-22 NOTE — Telephone Encounter (Signed)
S/w Dr. Farrell Ours who confirmed pt has appt today with a general cardiologist in [place]. Informed MD records had been faxed to number provided by pt sister. He is appreciative of the information and states they are on Epic and should be able to access pt records. He will call back if he has any questions.

## 2016-02-24 ENCOUNTER — Telehealth: Payer: Self-pay | Admitting: Cardiovascular Disease

## 2016-02-24 NOTE — Telephone Encounter (Signed)
S/w pt who had Feb 28 appt w/Dr. Ave Filter, cardiologist, in [place]. She did not see Dr. Farrell Ours.  Pt reports Dr. Ave Filter prescribed Plavix  daily. She is still taking  aspirin. She is unsure if she should take this and would like Dr. Jari Sportsman recommendations since he is more familiar w/her case. Advised pt to continue plavix as prescribed and will forward to MD to advise.

## 2016-02-24 NOTE — Telephone Encounter (Signed)
Pt states her "new cardiologist" has put her on Plavix, which is different than what Dr. Kirke Corin put her on. Please call. States she has a new cardiologist due to it being closer to where she lives.

## 2016-02-24 NOTE — Telephone Encounter (Signed)
Left message on machine for patient to contact the office.   

## 2016-02-25 NOTE — Telephone Encounter (Signed)
Plavix is optional for 1 year. If she is on it then she should decrease Aspirin to 81 mg once daily.

## 2016-02-25 NOTE — Telephone Encounter (Signed)
S/w pt of Dr. Jari SportsmanArida's recommendations. Pt verbalized understanding and agreeable w/plan

## 2016-02-25 NOTE — Telephone Encounter (Signed)
Attempted to call pt. No answer, mailbox full. Will call again.

## 2016-11-25 IMAGING — CR DG CHEST 1V PORT
1 series · 1 of 1 positions shown · non-contrast
Comparison: 01/15/2016

CLINICAL DATA: Post CABG

EXAM:
PORTABLE CHEST 1 VIEW

[AP]
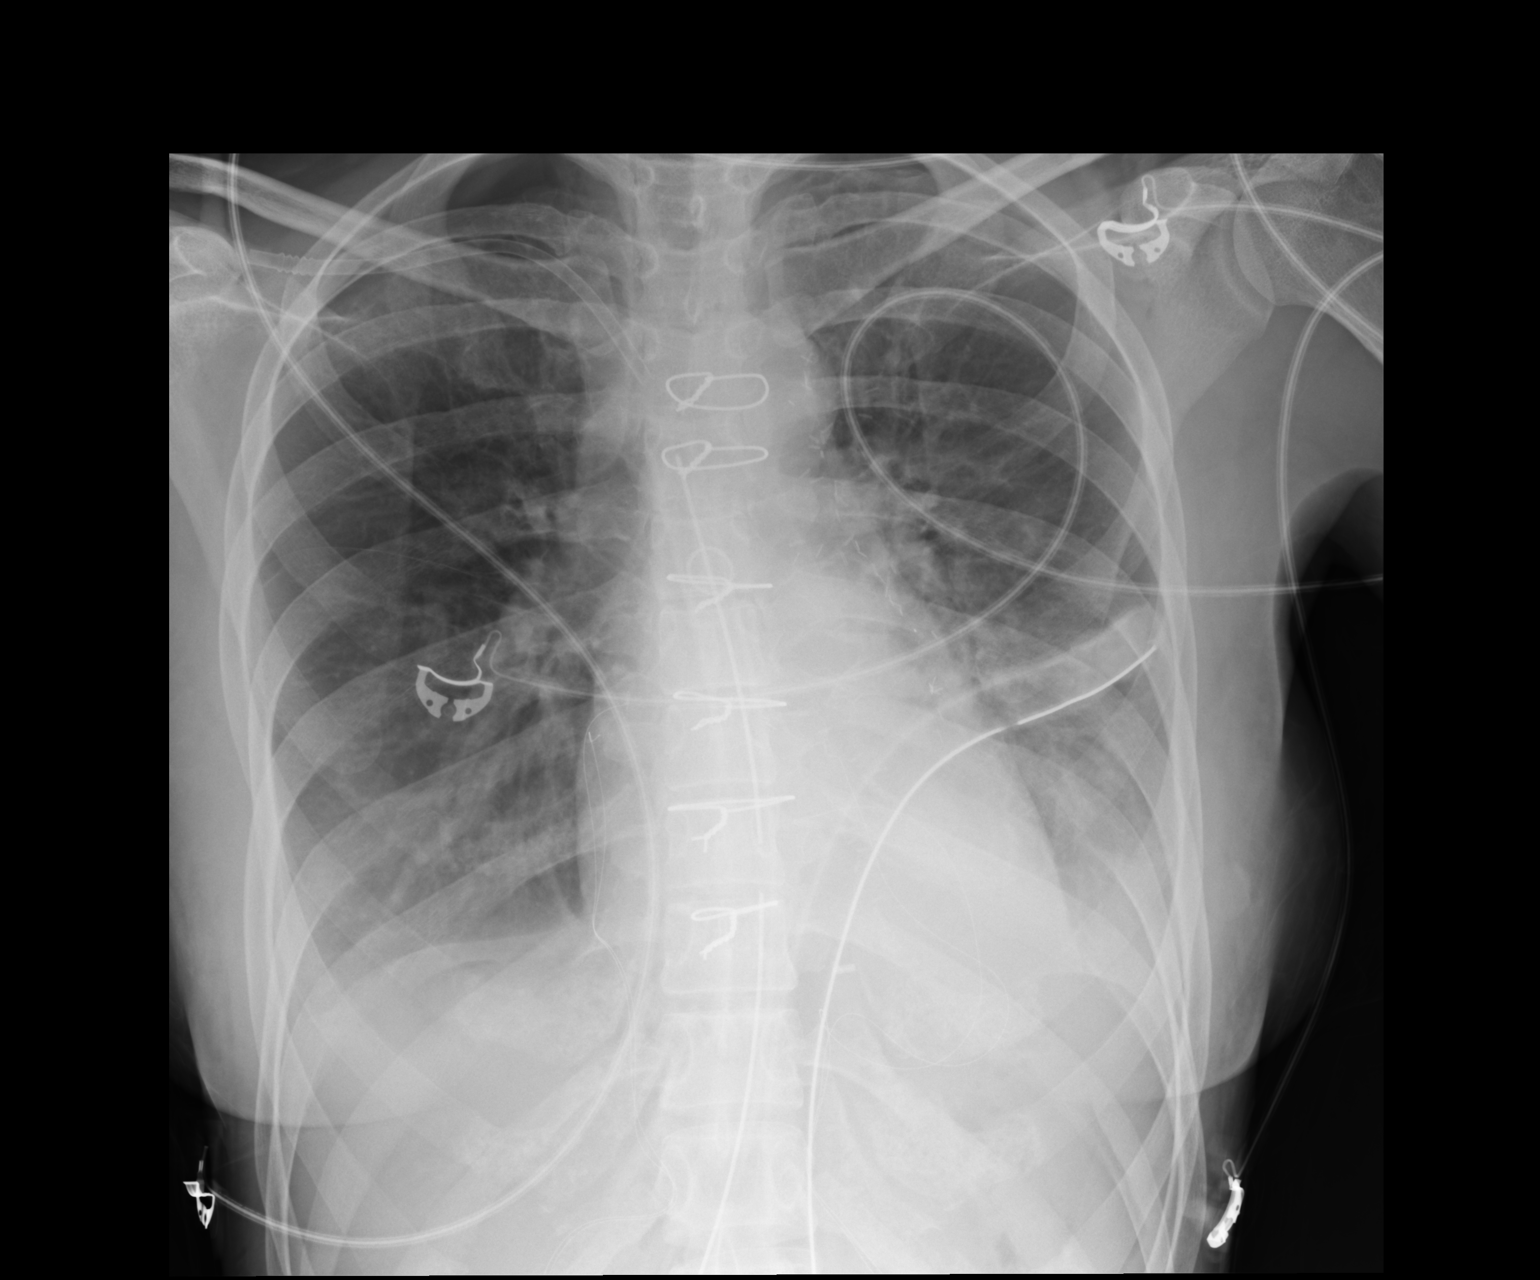

[1 of 1 positions shown; findings below may reference images not displayed]

FINDINGS: Cardiomediastinal silhouette is stable. Status post CABG. Left chest
tube and mediastinal drain is unchanged in position. Right
subclavian Swan-Ganz catheter has been removed. Endotracheal tube
has been removed. There is right subclavian sheath in place with tip
in upper SVC. No pneumothorax. Small bilateral pleural effusion with
bilateral basilar atelectasis. Tiny wires projecting over cardiac
silhouette are unchanged in position.
IMPRESSION: Status post CABG. Left chest tube and mediastinal drain is unchanged
in position. Right subclavian Swan-Ganz catheter has been removed.
Endotracheal tube has been removed. There is right subclavian sheath
in place with tip in upper SVC. No pneumothorax. Small bilateral
pleural effusion with bilateral basilar atelectasis.

## 2016-11-26 IMAGING — CR DG CHEST 1V PORT
1 series · 1 of 1 positions shown · non-contrast
Comparison: 01/16/2016

CLINICAL DATA: CABG

EXAM:
PORTABLE CHEST 1 VIEW

[AP]
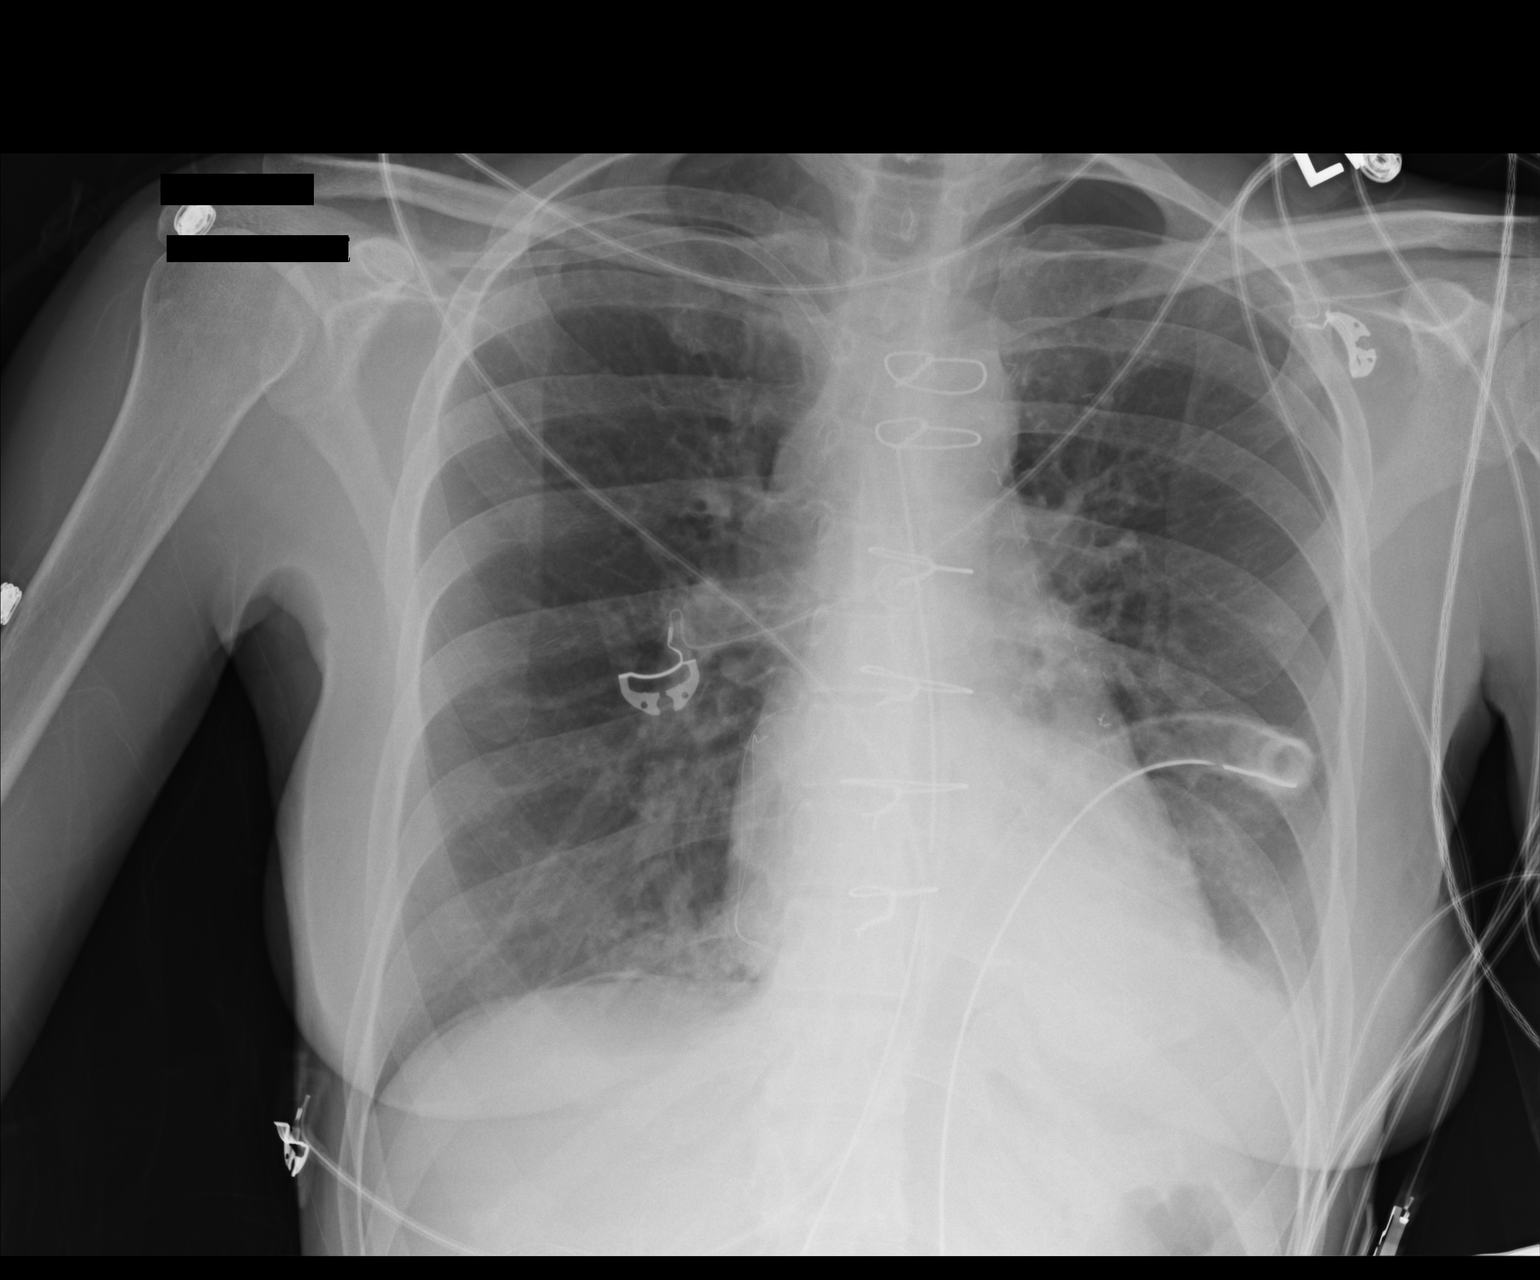

[1 of 1 positions shown; findings below may reference images not displayed]

FINDINGS: Changes of CABG. Left chest tube and mediastinal drain remain in
place, unchanged. Left apical pneumothorax noted, new since prior
study, likely 5-10%. Bibasilar atelectasis, improving on the right,
not significantly changed on the left. Heart is normal size.
IMPRESSION: New small left apical pneumothorax, 5-10%. Left chest tube remains
in stable position.

Bibasilar atelectasis, improving on the right.

## 2016-11-28 IMAGING — DX DG CHEST 2V
2 series · 2 of 2 positions shown · non-contrast
Comparison: 01/18/2016

CLINICAL DATA: Sore chest, followup pneumothorax, history cardiac
surgery

EXAM:
CHEST  2 VIEW

[w chest pa]
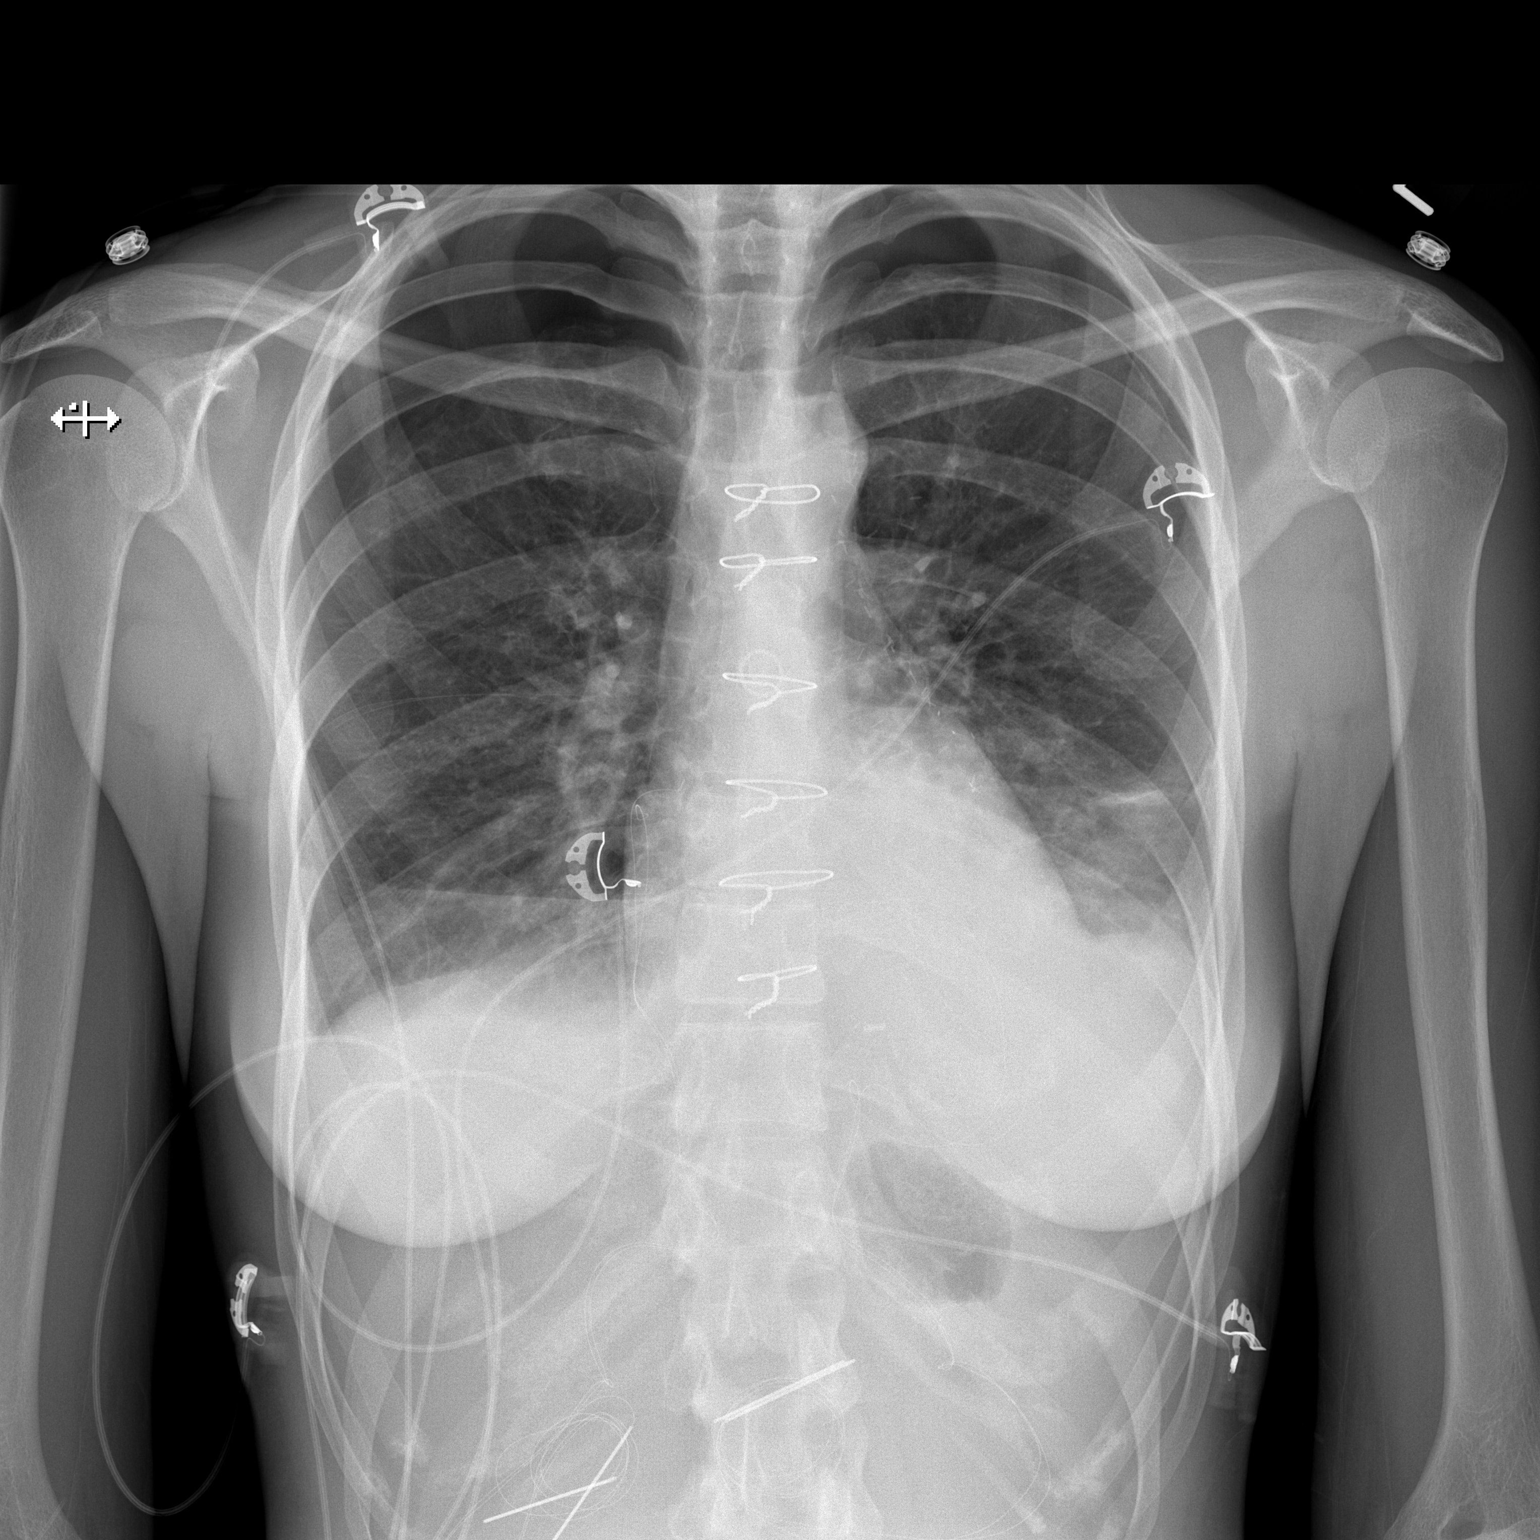

[w chest lat]
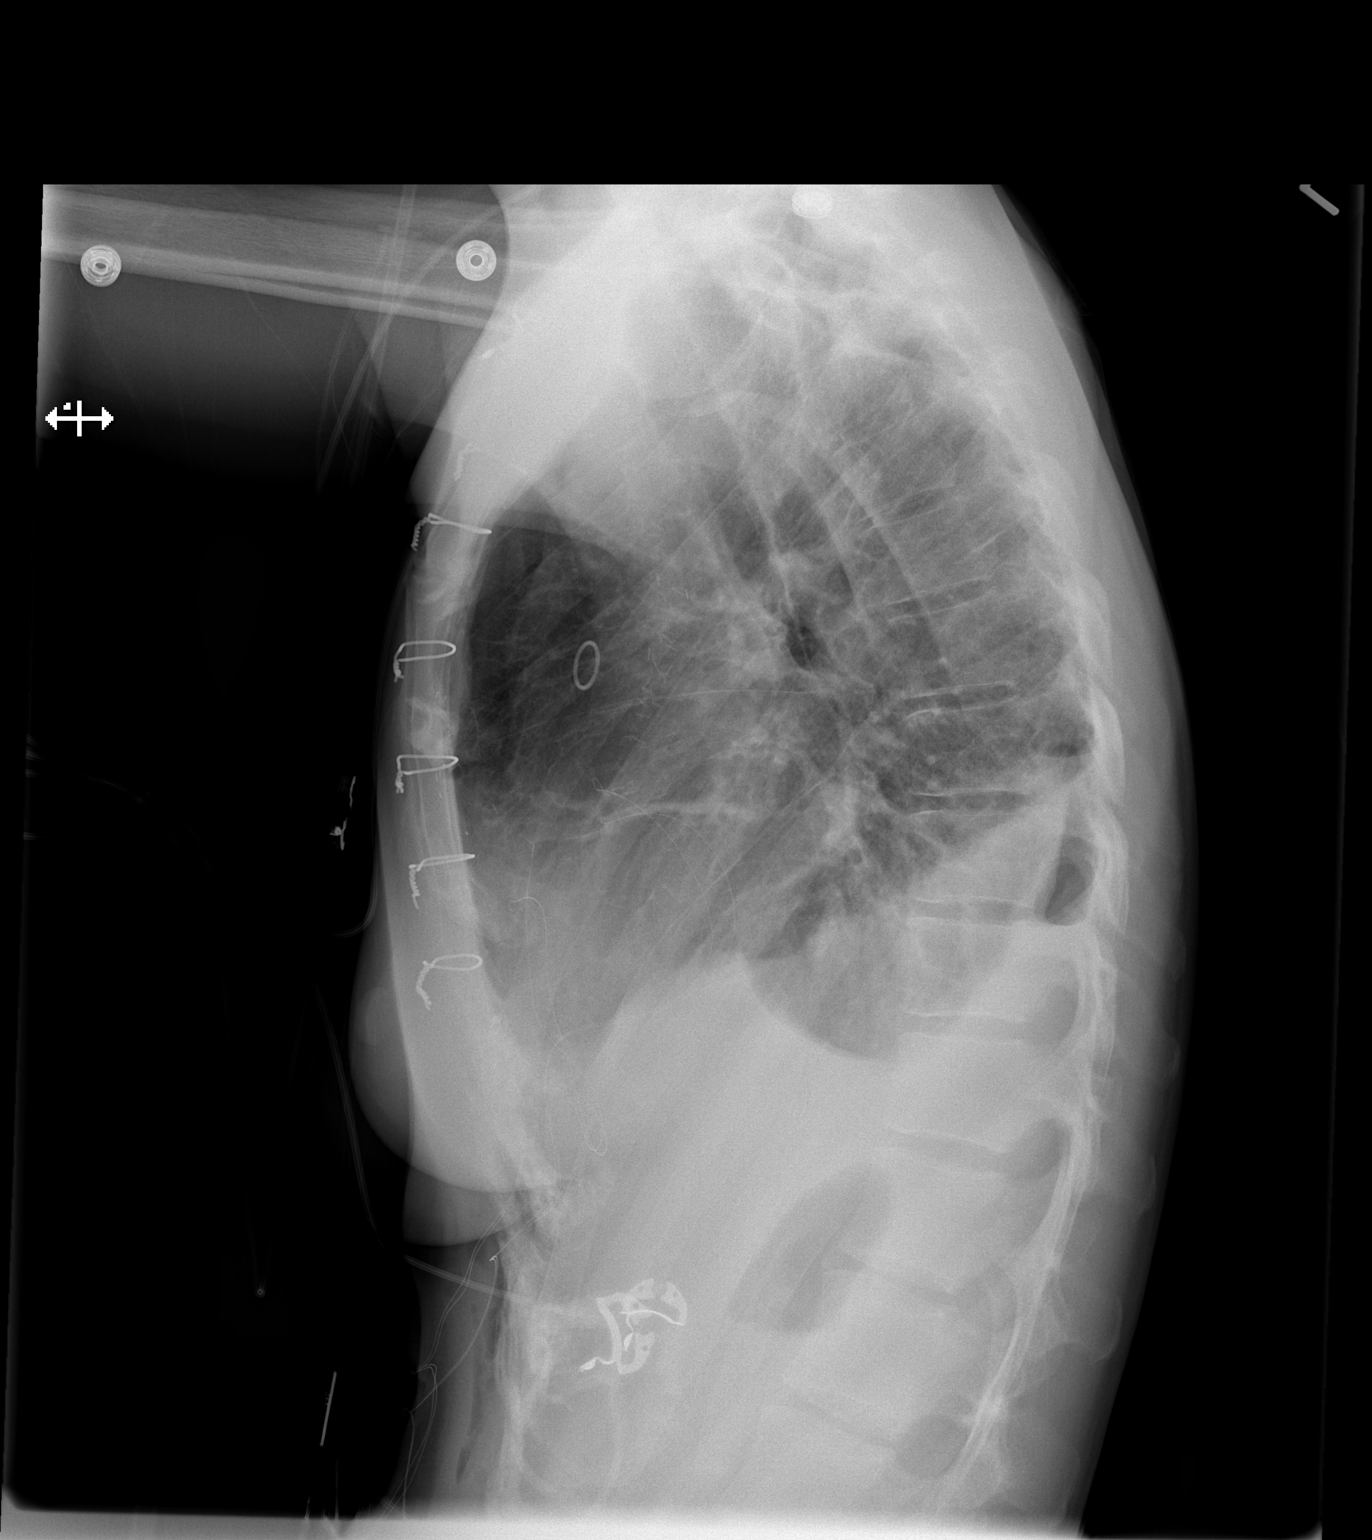

[2 of 2 positions shown; findings below may reference images not displayed]

FINDINGS: Epicardial pacing wires identified.

Normal heart size post CABG.

Stable mediastinal contours.

Biapical pneumothoraces again identified greater on RIGHT, not
significantly changed.

Bibasilar atelectasis and small pleural effusions.

No acute osseous findings
IMPRESSION: Persistent BILATERAL hydropneumothoraces greater on RIGHT.

Pneumothorax components appear stable since previous exam while the
basilar pleural effusions have slightly increased.

## 2020-12-16 ENCOUNTER — Telehealth: Payer: Self-pay

## 2020-12-16 NOTE — Telephone Encounter (Signed)
Patient previously seen in 2017 by [place] and would liked to be worked in with him.  Patient aware provider no longer taking new patients but she declined to be seen by anyone else as Dr. Kirke Corin saved her in an emergency situation.   Please advise patient wants asap visit

## 2020-12-16 NOTE — Telephone Encounter (Signed)
Ok to schedule.

## 2020-12-30 ENCOUNTER — Ambulatory Visit: Payer: BLUE CROSS/BLUE SHIELD | Admitting: Cardiovascular Disease

## 2021-01-11 ENCOUNTER — Ambulatory Visit: Payer: BLUE CROSS/BLUE SHIELD | Admitting: Cardiovascular Disease

## 2021-01-18 ENCOUNTER — Ambulatory Visit: Payer: BLUE CROSS/BLUE SHIELD | Admitting: Cardiovascular Disease

## 2021-01-20 ENCOUNTER — Other Ambulatory Visit: Payer: Self-pay

## 2021-01-20 ENCOUNTER — Encounter: Payer: Self-pay | Admitting: Cardiovascular Disease

## 2021-01-20 ENCOUNTER — Ambulatory Visit: Payer: BC Managed Care – PPO | Admitting: Cardiovascular Disease

## 2021-01-20 VITALS — BP 92/68 | HR 77 | Ht 66.5 in | Wt 139.2 lb

## 2021-01-20 DIAGNOSIS — I2510 Atherosclerotic heart disease of native coronary artery without angina pectoris: Secondary | ICD-10-CM | POA: Diagnosis not present

## 2021-01-20 DIAGNOSIS — I255 Ischemic cardiomyopathy: Secondary | ICD-10-CM | POA: Diagnosis not present

## 2021-01-20 NOTE — Patient Instructions (Signed)
Medication Instructions:  Your physician recommends that you continue on your current medications as directed. Please refer to the Current Medication list given to you today.  *If you need a refill on your cardiac medications before your next appointment, please call your pharmacy*   Lab Work: None ordered If you have labs (blood work) drawn today and your tests are completely normal, you will receive your results only by: Marland Kitchen MyChart Message (if you have MyChart) OR . A paper copy in the mail If you have any lab test that is abnormal or we need to change your treatment, we will call you to review the results.   Testing/Procedures: Your physician has requested that you have an echocardiogram. Echocardiography is a painless test that uses sound waves to create images of your heart. It provides your doctor with information about the size and shape of your heart and how well your heart's chambers and valves are working. This procedure takes approximately one hour. There are no restrictions for this procedure. (To be scheduled in 6 months)   Follow-Up: At [facility], you and your health needs are our priority.  As part of our continuing mission to provide you with exceptional heart care, we have created designated Provider Care Teams.  These Care Teams include your primary Cardiologist (physician) and Advanced Practice Providers (APPs -  Physician Assistants and Nurse Practitioners) who all work together to provide you with the care you need, when you need it.  We recommend signing up for the patient portal called "MyChart".  Sign up information is provided on this After Visit Summary.  MyChart is used to connect with patients for Virtual Visits (Telemedicine).  Patients are able to view lab/test results, encounter notes, upcoming appointments, etc.  Non-urgent messages can be sent to your provider as well.   To learn more about what you can do with MyChart, go to [email or web address].     Your next appointment:   6 month(s) to be scheduled the same day as the echo  The format for your next appointment:   In Person  Provider:   You may see  Lorine Bears, MD or one of the following Advanced Practice Providers on your designated Care Team:    Nicolasa Ducking, NP  Eula Listen, PA-C  Marisue Ivan, PA-C  Cadence Worden, [place]  Gillian Shields, NP    Other Instructions N/A

## 2021-01-20 NOTE — Progress Notes (Signed)
Cardiology Office Note   Date:  01/20/2021   ID:  Karina Flynn, DOB 15-Aug-1981, MRN [id number]  PCP:  Patient, No Pcp Per  Cardiologist:   Lorine Bears, MD   Chief Complaint  Patient presents with  . New Patient (Initial Visit)    Establish care for left main coronary artery dissection. "doing well."       History of Present Illness: Karina Flynn is a 40 y.o. 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 She presented in January 2017 with non-ST elevation myocardial infarction. She underwent urgent cardiac catheterization which showed spontaneous coronary artery dissection starting at the distal left main into both LAD and left circumflex. The left circumflex complex was completely occluded. The right coronary artery was normal. Her condition worsened during cardiac catheterization and thus an intra-aortic balloon pump was placed and she was transferred to [facility] where she underwent emergent CABG with LIMA to LAD and SVG to OM. Intraoperative TEE showed an ejection fraction of 40% with akinesis of the anterior and lateral myocardium. Postoperative course was unremarkable except for sinus tachycardia which was well-controlled with carvedilol.  The patient has seen multiple cardiologist since then in the [place] area and she felt more comfortable following up with [place] although she lives 2 hours away.  She has been doing very well with no recent chest pain, shortness of breath or palpitations.  She had a lot of questions about COVID-19 vaccination and her condition.  Her blood pressure is usually in the low side and she does feel occasionally dizzy.  She continues to be on small dose carvedilol and lisinopril.    History reviewed. No pertinent past medical history.  Past Surgical History:  Procedure Laterality Date  . CARDIAC CATHETERIZATION N/A 01/14/2016   Procedure: Coronary Angiogram;   Surgeon: Iran Ouch, MD;  Location: [facility] INVASIVE CV LAB;  Service: Cardiovascular;  Laterality: N/A;  . CARDIAC CATHETERIZATION N/A 01/14/2016   Procedure: IABP Insertion;  Surgeon: Iran Ouch, MD;  Location: [facility] INVASIVE CV LAB;  Service: Cardiovascular;  Laterality: N/A;  . CORONARY ARTERY BYPASS GRAFT Left 01/14/2016   Procedure: Coronary artery bypass graft times using right internal mammary artery and left greater saphenous vein via endovein harvest.;  Surgeon: Loreli Slot, MD;  Location: [facility] OR;  Service: Open Heart Surgery;  Laterality: Left;  . INNER EAR SURGERY       Current Outpatient Medications  Medication Sig Dispense Refill  . aspirin 81 MG EC tablet Take 1 tablet by mouth daily.    . carvedilol (COREG) 6.25 MG tablet Take 6.25 mg by mouth 2 (two) times daily with a meal.    . ibuprofen (ADVIL,MOTRIN) 200 MG tablet Take 200 mg by mouth every 6 (six) hours as needed for headache.    . lisinopril (PRINIVIL,ZESTRIL) 2.5 MG tablet Take 1 tablet (2.5 mg total) by mouth daily. 30 tablet 3  . Multiple Vitamins-Minerals (HAIR/SKIN/NAILS PO) Take 1 tablet by mouth daily.    . traMADol (ULTRAM) 50 MG tablet Take 1-2 tablets (50-100 mg total) by mouth every 4 (four) hours as needed for moderate pain. (Patient not taking: Reported on 01/20/2021) 30 tablet 0   No current facility-administered medications for this visit.    Allergies:   Ceclor [cefaclor]    Social History:  The patient  reports that she has never smoked. She has never used smokeless tobacco. She reports that she does not drink alcohol  and does not use drugs.   Family History:  The patient's Family history is unknown by patient.    ROS:  Please see the history of present illness.   Otherwise, review of systems are positive for none.   All other systems are reviewed and negative.    PHYSICAL EXAM: VS:  BP 92/68 (BP Location: Right Arm, Patient Position: Sitting, Cuff Size: Normal)   Pulse 77   Ht  5' 6.5" (1.689 m)   Wt 139 lb 4 oz (63.2 kg)   SpO2 98%   BMI 22.14 kg/m  , BMI Body mass index is 22.14 kg/m. GEN: Well nourished, well developed, in no acute distress  HEENT: normal  Neck: no JVD, carotid bruits, or masses Cardiac: RRR; no murmurs, rubs, or gallops,no edema  Respiratory:  clear to auscultation bilaterally, normal work of breathing GI: soft, nontender, nondistended, + BS MS: no deformity or atrophy  Skin: warm and dry, no rash Neuro:  Strength and sensation are intact Psych: euthymic mood, full affect   EKG:  EKG is ordered today. The ekg ordered today demonstrates sinus rhythm with possible left atrial enlargement.   Recent Labs: No results found for requested labs within last 8760 hours.    Lipid Panel    Component Value Date/Time   CHOL 60 01/18/2016 0323   TRIG 59 01/18/2016 0323   HDL 20 (L) 01/18/2016 0323   CHOLHDL 3.0 01/18/2016 0323   VLDL 12 01/18/2016 0323   LDLCALC 28 01/18/2016 0323      Wt Readings from Last 3 Encounters:  01/20/21 139 lb 4 oz (63.2 kg)  02/15/16 120 lb (54.4 kg)  02/15/16 115 lb (52.2 kg)      PAD Screen 01/20/2021  Previous PAD dx? No  Previous surgical procedure? No  Pain with walking? No  Feet/toe relief with dangling? No  Painful, non-healing ulcers? No  Extremities discolored? No      ASSESSMENT AND PLAN:  1.  Coronary artery disease involving native coronary arteries status post two-vessel CABG in 2017 for spontaneous coronary artery dissection involving left main coronary artery into both the LAD and left circumflex.  She is doing well with no anginal symptoms.  Continue medical therapy.  2.  Chronic systolic heart failure due to ischemic cardiomyopathy: No evidence of volume overload.  Blood pressure tends to be on the low side.  I am going to repeat her echocardiogram in 6 months to reevaluate her ejection fraction and see if we need to make adjustments in her medications.  If her EF is normal, I  plan on stopping lisinopril given dizziness and intermittent hypotension.  The patient has no history of hyperlipidemia and does not need to be on a statin given that the mechanism for her myocardial infarction was not due to atherosclerosis.  I explained to her that I do not see a contraindication for mRNA COVID-19 vaccination.    Disposition:   Schedule echocardiogram and follow-up in 6 months.  Signed,  Lorine Bears, MD  01/20/2021 3:30 PM    [facility]

## 2021-02-24 ENCOUNTER — Telehealth: Payer: Self-pay | Admitting: Cardiovascular Disease

## 2021-02-24 MED ORDER — LISINOPRIL 2.5 MG PO TABS: 2.5000 mg | ORAL_TABLET | Freq: Every day | ORAL | 0 refills | Status: DC

## 2021-02-24 MED ORDER — CARVEDILOL 6.25 MG PO TABS: 6.2500 mg | ORAL_TABLET | Freq: Two times a day (BID) | ORAL | 0 refills | Status: DC

## 2021-02-24 NOTE — Telephone Encounter (Signed)
*  STAT* If patient is at the pharmacy, call can be transferred to refill team.   1. Which medications need to be refilled? (please list name of each medication and dose if known)  Carvedilol 6.25 MG 1 tablet 2 times daily  Lisinopril 2.5 MG 1 time daily  2. Which pharmacy/location (including street and city if local pharmacy) is medication to be sent to? [facility] [address] [place], [place] [address]  3. Do they need a 30 day or 90 day supply? 90 day

## 2021-02-24 NOTE — Telephone Encounter (Signed)
Requested Prescriptions   Signed Prescriptions Disp Refills   lisinopril (ZESTRIL) 2.5 MG tablet 90 tablet 0    Sig: Take 1 tablet (2.5 mg total) by mouth daily.    Authorizing Provider: Lorine Bears A    Ordering User: Thayer Headings, Cortavius Montesinos L   carvedilol (COREG) 6.25 MG tablet 180 tablet 0    Sig: Take 1 tablet (6.25 mg total) by mouth 2 (two) times daily with a meal.    Authorizing Provider: Lorine Bears A    Ordering User: Thayer Headings, Camryn Lampson L

## 2021-05-24 ENCOUNTER — Telehealth: Payer: Self-pay | Admitting: Cardiovascular Disease

## 2021-05-24 MED ORDER — CARVEDILOL 6.25 MG PO TABS: 6.2500 mg | ORAL_TABLET | Freq: Two times a day (BID) | ORAL | 0 refills | Status: DC

## 2021-05-24 MED ORDER — LISINOPRIL 2.5 MG PO TABS: 2.5000 mg | ORAL_TABLET | Freq: Every day | ORAL | 0 refills | Status: DC

## 2021-05-24 NOTE — Telephone Encounter (Signed)
Requested Prescriptions   Signed Prescriptions Disp Refills   lisinopril (ZESTRIL) 2.5 MG tablet 90 tablet 0    Sig: Take 1 tablet (2.5 mg total) by mouth daily.    Authorizing Provider: Lorine Bears A    Ordering User: Thayer Headings, Sakiya Stepka L   carvedilol (COREG) 6.25 MG tablet 180 tablet 0    Sig: Take 1 tablet (6.25 mg total) by mouth 2 (two) times daily with a meal.    Authorizing Provider: Lorine Bears A    Ordering User: Thayer Headings, Pati Thinnes L

## 2021-05-24 NOTE — Telephone Encounter (Signed)
*  STAT* If patient is at the pharmacy, call can be transferred to refill team.   1. Which medications need to be refilled? (please list name of each medication and dose if known) carvedilol and lisinopril   2. Which pharmacy/location (including street and city if local pharmacy) is medication to be sent to? [facility] in [place], [place]  3. Do they need a 30 day or 90 day supply? 90

## 2021-06-21 ENCOUNTER — Ambulatory Visit: Payer: BC Managed Care – PPO | Admitting: Cardiovascular Disease

## 2021-06-21 ENCOUNTER — Ambulatory Visit (INDEPENDENT_AMBULATORY_CARE_PROVIDER_SITE_OTHER): Payer: BC Managed Care – PPO

## 2021-06-21 ENCOUNTER — Encounter: Payer: Self-pay | Admitting: Cardiovascular Disease

## 2021-06-21 ENCOUNTER — Other Ambulatory Visit: Payer: Self-pay

## 2021-06-21 VITALS — BP 100/60 | HR 74 | Ht 66.0 in | Wt 135.5 lb

## 2021-06-21 DIAGNOSIS — I255 Ischemic cardiomyopathy: Secondary | ICD-10-CM

## 2021-06-21 DIAGNOSIS — I2510 Atherosclerotic heart disease of native coronary artery without angina pectoris: Secondary | ICD-10-CM | POA: Diagnosis not present

## 2021-06-21 LAB — ECHOCARDIOGRAM COMPLETE
AR max vel: 2.99 cm2
AV Area VTI: 2.74 cm2
AV Area mean vel: 2.67 cm2
AV Mean grad: 3 mmHg
AV Peak grad: 5.9 mmHg
Ao pk vel: 1.21 m/s
Area-P 1/2: 3.51 cm2
Calc EF: 46.8 %
S' Lateral: 3.8 cm
Single Plane A2C EF: 42.8 %
Single Plane A4C EF: 48.1 %

## 2021-06-21 MED ORDER — CARVEDILOL 6.25 MG PO TABS: 6.2500 mg | ORAL_TABLET | Freq: Two times a day (BID) | ORAL | 3 refills | Status: DC

## 2021-06-21 MED ORDER — LISINOPRIL 2.5 MG PO TABS: 2.5000 mg | ORAL_TABLET | Freq: Every day | ORAL | 3 refills | Status: DC

## 2021-06-21 MED ORDER — PERFLUTREN LIPID MICROSPHERE
1.0000 mL | INTRAVENOUS | Status: AC | PRN
  Administered 2021-06-21: 2 mL via INTRAVENOUS

## 2021-06-21 NOTE — Patient Instructions (Signed)
Medication Instructions:  Your physician recommends that you continue on your current medications as directed. Please refer to the Current Medication list given to you today.   Your cardiac medications have been refilled today.  *If you need a refill on your cardiac medications before your next appointment, please call your pharmacy*   Lab Work: Lipid, Cmp, Cbc today  If you have labs (blood work) drawn today and your tests are completely normal, you will receive your results only by: MyChart Message (if you have MyChart) OR A paper copy in the mail If you have any lab test that is abnormal or we need to change your treatment, we will call you to review the results.   Testing/Procedures: None ordered   Follow-Up: At [facility], you and your health needs are our priority.  As part of our continuing mission to provide you with exceptional heart care, we have created designated Provider Care Teams.  These Care Teams include your primary Cardiologist (physician) and Advanced Practice Providers (APPs -  Physician Assistants and Nurse Practitioners) who all work together to provide you with the care you need, when you need it.  We recommend signing up for the patient portal called "MyChart".  Sign up information is provided on this After Visit Summary.  MyChart is used to connect with patients for Virtual Visits (Telemedicine).  Patients are able to view lab/test results, encounter notes, upcoming appointments, etc.  Non-urgent messages can be sent to your provider as well.   To learn more about what you can do with MyChart, go to [email or web address].    Your next appointment:   12 month(s)  The format for your next appointment:   In Person  Provider:   You may see Lorine Bears, MD or one of the following Advanced Practice Providers on your designated Care Team:   Nicolasa Ducking, NP Eula Listen, PA-C Marisue Ivan, PA-C Cadence [place], [place] Gillian Shields, NP   Other  Instructions N/A

## 2021-06-21 NOTE — Progress Notes (Signed)
Cardiology Office Note   Date:  06/21/2021   ID:  Karina Flynn, DOB November 27, 1981, MRN [id number]  PCP:  Patient, No Pcp Per (Inactive)  Cardiologist:   Lorine Bears, MD   Chief Complaint  Patient presents with   Other    F/u echo no complaints today. Meds reviewed verbally with pt.      History of Present Illness: Karina Flynn is a 40 y.o. 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 She presented in January 2017 with non-ST elevation myocardial infarction. She underwent urgent cardiac catheterization which showed spontaneous coronary artery dissection starting at the distal left main into both LAD and left circumflex. The left circumflex complex was completely occluded. The right coronary artery was normal. Her condition worsened during cardiac catheterization and thus an intra-aortic balloon pump was placed and she was transferred to [facility] where she underwent emergent CABG with LIMA to LAD and SVG to OM. Intraoperative TEE showed an ejection fraction of 40% with akinesis of the anterior and lateral myocardium. Postoperative course was unremarkable except for sinus tachycardia which was well-controlled with carvedilol.   She has been doing very well with no chest pain, shortness of breath or palpitations.  She takes her medications regularly.  She had an echocardiogram done today which showed an EF of 45 to 50% with severe apical hypokinesis.   History reviewed. No pertinent past medical history.  Past Surgical History:  Procedure Laterality Date   CARDIAC CATHETERIZATION N/A 01/14/2016   Procedure: Coronary Angiogram;  Surgeon: Iran Ouch, MD;  Location: [facility] INVASIVE CV LAB;  Service: Cardiovascular;  Laterality: N/A;   CARDIAC CATHETERIZATION N/A 01/14/2016   Procedure: IABP Insertion;  Surgeon: Iran Ouch, MD;  Location: [facility] INVASIVE CV LAB;  Service: Cardiovascular;   Laterality: N/A;   CORONARY ARTERY BYPASS GRAFT Left 01/14/2016   Procedure: Coronary artery bypass graft times using right internal mammary artery and left greater saphenous vein via endovein harvest.;  Surgeon: Loreli Slot, MD;  Location: [facility] OR;  Service: Open Heart Surgery;  Laterality: Left;   INNER EAR SURGERY       Current Outpatient Medications  Medication Sig Dispense Refill   aspirin 81 MG EC tablet Take 1 tablet by mouth daily.     carvedilol (COREG) 6.25 MG tablet Take 1 tablet (6.25 mg total) by mouth 2 (two) times daily with a meal. 180 tablet 0   ibuprofen (ADVIL,MOTRIN) 200 MG tablet Take 200 mg by mouth every 6 (six) hours as needed for headache.     lisinopril (ZESTRIL) 2.5 MG tablet Take 1 tablet (2.5 mg total) by mouth daily. 90 tablet 0   Multiple Vitamins-Minerals (HAIR/SKIN/NAILS PO) Take 1 tablet by mouth daily.     Current Facility-Administered Medications  Medication Dose Route Frequency Provider Last Rate Last Admin   perflutren lipid microspheres (DEFINITY) IV suspension  1-10 mL Intravenous PRN Iran Ouch, MD   2 mL at 06/21/21 1518    Allergies:   Ceclor [cefaclor]    Social History:  The patient  reports that she has never smoked. She has never used smokeless tobacco. She reports that she does not drink alcohol and does not use drugs.   Family History:  The patient's Family history is unknown by patient.    ROS:  Please see the history of present illness.   Otherwise, review of systems are positive for none.   All other systems are  reviewed and negative.    PHYSICAL EXAM: VS:  BP 100/60 (BP Location: Left Arm, Patient Position: Sitting, Cuff Size: Normal)   Pulse 74   Ht 5\' 6"  (1.676 m)   Wt 135 lb 8 oz (61.5 kg)   SpO2 98%   BMI 21.87 kg/m  , BMI Body mass index is 21.87 kg/m. GEN: Well nourished, well developed, in no acute distress  HEENT: normal  Neck: no JVD, carotid bruits, or masses Cardiac: RRR; no murmurs, rubs, or  gallops,no edema  Respiratory:  clear to auscultation bilaterally, normal work of breathing GI: soft, nontender, nondistended, + BS MS: no deformity or atrophy  Skin: warm and dry, no rash Neuro:  Strength and sensation are intact Psych: euthymic mood, full affect   EKG:  EKG is ordered today. The ekg ordered today demonstrates normal sinus rhythm with sinus arrhythmia.   Recent Labs: No results found for requested labs within last 8760 hours.    Lipid Panel    Component Value Date/Time   CHOL 60 01/18/2016 0323   TRIG 59 01/18/2016 0323   HDL 20 (L) 01/18/2016 0323   CHOLHDL 3.0 01/18/2016 0323   VLDL 12 01/18/2016 0323   LDLCALC 28 01/18/2016 0323      Wt Readings from Last 3 Encounters:  06/21/21 135 lb 8 oz (61.5 kg)  01/20/21 139 lb 4 oz (63.2 kg)  02/15/16 120 lb (54.4 kg)      PAD Screen 01/20/2021  Previous PAD dx? No  Previous surgical procedure? No  Pain with walking? No  Feet/toe relief with dangling? No  Painful, non-healing ulcers? No  Extremities discolored? No      ASSESSMENT AND PLAN:  1.  Coronary artery disease involving native coronary arteries status post two-vessel CABG in 2017 for spontaneous coronary artery dissection involving left main coronary artery into both the LAD and left circumflex.  She is doing well with no anginal symptoms.  Continue medical therapy.  I refilled her medications.  2.  Chronic systolic heart failure due to ischemic cardiomyopathy: No evidence of volume overload.  Her ejection fraction today is 45 to 50% on echo.  Based on this, I recommend continuing small dose carvedilol and lisinopril.   I reviewed the results of echocardiogram with her today and personally reviewed the echocardiogram images.  3. The patient has no history of hyperlipidemia and does not need to be on a statin given that the mechanism for her myocardial infarction was not due to atherosclerosis.  However, I am going to check lipid profile  today.  4.  Health maintenance: The patient has not had any labs in the recent years and I requested routine labs today.    Disposition: Follow-up in 12 months.  Signed,  2018, MD  06/21/2021 3:25 PM    [facility]

## 2021-06-22 LAB — COMPREHENSIVE METABOLIC PANEL
ALT: 13 IU/L (ref 0–32)
AST: 18 IU/L (ref 0–40)
Albumin/Globulin Ratio: 2 (ref 1.2–2.2)
Albumin: 4.7 g/dL (ref 3.8–4.8)
Alkaline Phosphatase: 70 IU/L (ref 44–121)
BUN/Creatinine Ratio: 19 (ref 9–23)
BUN: 17 mg/dL (ref 6–20)
Bilirubin Total: 0.6 mg/dL (ref 0.0–1.2)
CO2: 22 mmol/L (ref 20–29)
Calcium: 9.8 mg/dL (ref 8.7–10.2)
Chloride: 102 mmol/L (ref 96–106)
Creatinine, Ser: 0.88 mg/dL (ref 0.57–1.00)
Globulin, Total: 2.3 g/dL (ref 1.5–4.5)
Glucose: 95 mg/dL (ref 65–99)
Potassium: 4.5 mmol/L (ref 3.5–5.2)
Sodium: 139 mmol/L (ref 134–144)
Total Protein: 7 g/dL (ref 6.0–8.5)
eGFR: 86 mL/min/{1.73_m2} (ref >59)

## 2021-06-22 LAB — CBC WITH DIFFERENTIAL/PLATELET
Basophils Absolute: 0.1 10*3/uL (ref 0.0–0.2)
Basos: 1 %
EOS (ABSOLUTE): 0.1 10*3/uL (ref 0.0–0.4)
Eos: 1 %
Hematocrit: 41.7 % (ref 34.0–46.6)
Hemoglobin: 14 g/dL (ref 11.1–15.9)
Immature Grans (Abs): 0 10*3/uL (ref 0.0–0.1)
Immature Granulocytes: 0 %
Lymphocytes Absolute: 1.7 10*3/uL (ref 0.7–3.1)
Lymphs: 18 %
MCH: 30.4 pg (ref 26.6–33.0)
MCHC: 33.6 g/dL (ref 31.5–35.7)
MCV: 91 fL (ref 79–97)
Monocytes Absolute: 0.7 10*3/uL (ref 0.1–0.9)
Monocytes: 7 %
Neutrophils Absolute: 6.8 10*3/uL (ref 1.4–7.0)
Neutrophils: 73 %
Platelets: 192 10*3/uL (ref 150–450)
RBC: 4.61 x10E6/uL (ref 3.77–5.28)
RDW: 11.6 % — ABNORMAL LOW (ref 11.7–15.4)
WBC: 9.4 10*3/uL (ref 3.4–10.8)

## 2021-06-22 LAB — LIPID PANEL
Chol/HDL Ratio: 2.8 ratio (ref 0.0–4.4)
Cholesterol, Total: 151 mg/dL (ref 100–199)
HDL: 54 mg/dL (ref >39)
LDL Chol Calc (NIH): 81 mg/dL (ref 0–99)
Triglycerides: 84 mg/dL (ref 0–149)
VLDL Cholesterol Cal: 16 mg/dL (ref 5–40)

## 2021-06-28 NOTE — Addendum Note (Signed)
Addended by: Kendrick Fries on: 06/28/2021 09:40 AM   Modules accepted: Orders

## 2022-07-27 ENCOUNTER — Ambulatory Visit: Payer: BC Managed Care – PPO | Admitting: Cardiovascular Disease

## 2022-07-27 ENCOUNTER — Other Ambulatory Visit
Admission: RE | Admit: 2022-07-27 | Discharge: 2022-07-27 | Disposition: A | Discharge status: Home / Self Care | Payer: BC Managed Care – PPO | Attending: Cardiovascular Disease | Admitting: Cardiovascular Disease

## 2022-07-27 ENCOUNTER — Encounter: Payer: Self-pay | Admitting: Cardiovascular Disease

## 2022-07-27 VITALS — BP 104/76 | HR 74 | Ht 67.0 in | Wt 139.5 lb

## 2022-07-27 DIAGNOSIS — I2510 Atherosclerotic heart disease of native coronary artery without angina pectoris: Secondary | ICD-10-CM

## 2022-07-27 DIAGNOSIS — I5022 Chronic systolic (congestive) heart failure: Secondary | ICD-10-CM | POA: Insufficient documentation

## 2022-07-27 DIAGNOSIS — I2542 Coronary artery dissection: Secondary | ICD-10-CM | POA: Diagnosis not present

## 2022-07-27 LAB — BASIC METABOLIC PANEL
Anion gap: 6 (ref 5–15)
BUN: 17 mg/dL (ref 6–20)
CO2: 23 mmol/L (ref 22–32)
Calcium: 9.3 mg/dL (ref 8.9–10.3)
Chloride: 109 mmol/L (ref 98–111)
Creatinine, Ser: 0.8 mg/dL (ref 0.44–1.00)
GFR, Estimated: >60 mL/min (ref >60)
Glucose, Bld: 101 mg/dL — ABNORMAL HIGH (ref 70–99)
Potassium: 4.1 mmol/L (ref 3.5–5.1)
Sodium: 138 mmol/L (ref 135–145)

## 2022-07-27 LAB — CBC WITH DIFFERENTIAL/PLATELET
Abs Immature Granulocytes: 0.01 10*3/uL (ref 0.00–0.07)
Basophils Absolute: 0.1 10*3/uL (ref 0.0–0.1)
Basophils Relative: 1 %
Eosinophils Absolute: 0.2 10*3/uL (ref 0.0–0.5)
Eosinophils Relative: 3 %
HCT: 39 % (ref 36.0–46.0)
Hemoglobin: 12.7 g/dL (ref 12.0–15.0)
Immature Granulocytes: 0 %
Lymphocytes Relative: 23 %
Lymphs Abs: 1.5 10*3/uL (ref 0.7–4.0)
MCH: 29.7 pg (ref 26.0–34.0)
MCHC: 32.6 g/dL (ref 30.0–36.0)
MCV: 91.1 fL (ref 80.0–100.0)
Monocytes Absolute: 0.5 10*3/uL (ref 0.1–1.0)
Monocytes Relative: 8 %
Neutro Abs: 4.3 10*3/uL (ref 1.7–7.7)
Neutrophils Relative %: 65 %
Platelets: 186 10*3/uL (ref 150–400)
RBC: 4.28 MIL/uL (ref 3.87–5.11)
RDW: 12.3 % (ref 11.5–15.5)
WBC: 6.5 10*3/uL (ref 4.0–10.5)
nRBC: 0 % (ref 0.0–0.2)

## 2022-07-27 NOTE — Progress Notes (Signed)
Cardiology Office Note   Date:  07/27/2022   ID:  Karina Flynn, DOB 06/16/1981, MRN [id number]  PCP:  Patient, No Pcp Per  Cardiologist:   Lorine Bears, MD   Chief Complaint  Patient presents with   Other    12 month f/u no complaints today. Meds reviewed verbally with pt.      History of Present Illness: Karina Flynn is a 41 y.o. 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 She presented in January 2017 with non-ST elevation myocardial infarction. She underwent urgent cardiac catheterization which showed spontaneous coronary artery dissection starting at the distal left main into both LAD and left circumflex. The left circumflex complex was completely occluded. The right coronary artery was normal. Her condition worsened during cardiac catheterization and thus an intra-aortic balloon pump was placed and she was transferred to [facility] where she underwent emergent CABG with LIMA to LAD and SVG to OM. Intraoperative TEE showed an ejection fraction of 40%  Postoperative course was unremarkable except for sinus tachycardia which was well-controlled with carvedilol.   Echocardiogram in June 2022 showed an EF of 45 to 50% with mild mitral regurgitation.  She has been doing very well with no chest pain, shortness of breath or palpitations.  A significant portion of today's visit was spent on addressing the issue of risk of scad with future pregnancy.  She is considering getting pregnant but obviously very hesitant.   History reviewed. No pertinent past medical history.  Past Surgical History:  Procedure Laterality Date   CARDIAC CATHETERIZATION N/A 01/14/2016   Procedure: Coronary Angiogram;  Surgeon: Iran Ouch, MD;  Location: [facility] INVASIVE CV LAB;  Service: Cardiovascular;  Laterality: N/A;   CARDIAC CATHETERIZATION N/A 01/14/2016   Procedure: IABP Insertion;  Surgeon: Iran Ouch, MD;   Location: [facility] INVASIVE CV LAB;  Service: Cardiovascular;  Laterality: N/A;   CORONARY ARTERY BYPASS GRAFT Left 01/14/2016   Procedure: Coronary artery bypass graft times using right internal mammary artery and left greater saphenous vein via endovein harvest.;  Surgeon: Loreli Slot, MD;  Location: [facility] OR;  Service: Open Heart Surgery;  Laterality: Left;   INNER EAR SURGERY       Current Outpatient Medications  Medication Sig Dispense Refill   aspirin 81 MG EC tablet Take 1 tablet by mouth daily.     carvedilol (COREG) 6.25 MG tablet Take 1 tablet (6.25 mg total) by mouth 2 (two) times daily with a meal. 180 tablet 3   cetirizine (ZYRTEC) 10 MG chewable tablet Chew 10 mg by mouth daily as needed for allergies.     ibuprofen (ADVIL,MOTRIN) 200 MG tablet Take 200 mg by mouth every 6 (six) hours as needed for headache.     lisinopril (ZESTRIL) 2.5 MG tablet Take 1 tablet (2.5 mg total) by mouth daily. 90 tablet 3   Multiple Vitamins-Minerals (HAIR/SKIN/NAILS PO) Take 1 tablet by mouth daily.     No current facility-administered medications for this visit.    Allergies:   Ceclor [cefaclor]    Social History:  The patient  reports that she has never smoked. She has never used smokeless tobacco. She reports that she does not drink alcohol and does not use drugs.   Family History:  The patient's Family history is unknown by patient.    ROS:  Please see the history of present illness.   Otherwise, review of systems are positive for none.  All other systems are reviewed and negative.    PHYSICAL EXAM: VS:  BP 104/76 (BP Location: Left Arm, Patient Position: Sitting, Cuff Size: Normal)   Pulse 74   Ht 5\' 7"  (1.702 m)   Wt 139 lb 8 oz (63.3 kg)   SpO2 98%   BMI 21.85 kg/m  , BMI Body mass index is 21.85 kg/m. GEN: Well nourished, well developed, in no acute distress  HEENT: normal  Neck: no JVD, carotid bruits, or masses Cardiac: RRR; no murmurs, rubs, or gallops,no edema   Respiratory:  clear to auscultation bilaterally, normal work of breathing GI: soft, nontender, nondistended, + BS MS: no deformity or atrophy  Skin: warm and dry, no rash Neuro:  Strength and sensation are intact Psych: euthymic mood, full affect   EKG:  EKG is ordered today. The ekg ordered today demonstrates normal sinus rhythm with sinus arrhythmia.  No significant ischemic changes.   Recent Labs: No results found for requested labs within last 365 days.    Lipid Panel    Component Value Date/Time   CHOL 151 06/21/2021 1544   TRIG 84 06/21/2021 1544   HDL 54 06/21/2021 1544   CHOLHDL 2.8 06/21/2021 1544   CHOLHDL 3.0 01/18/2016 0323   VLDL 12 01/18/2016 0323   LDLCALC 81 06/21/2021 1544      Wt Readings from Last 3 Encounters:  07/27/22 139 lb 8 oz (63.3 kg)  06/21/21 135 lb 8 oz (61.5 kg)  01/20/21 139 lb 4 oz (63.2 kg)         01/20/2021    3:14 PM  PAD Screen  Previous PAD dx? No  Previous surgical procedure? No  Pain with walking? No  Feet/toe relief with dangling? No  Painful, non-healing ulcers? No  Extremities discolored? No      ASSESSMENT AND PLAN:  1.  Coronary artery disease involving native coronary arteries status post two-vessel CABG in 2017 for spontaneous coronary artery dissection involving left main coronary artery into both the LAD and left circumflex.  She is doing well with no anginal symptoms.  Continue medical therapy.  I refilled her medications.  I requested routine labs.  2.  Chronic systolic heart failure due to ischemic cardiomyopathy: No evidence of volume overload.  Her ejection fraction today is 45 to 50% on echo.  Based on this, I recommend continuing small dose carvedilol and lisinopril.    3. The patient has no history of hyperlipidemia and does not need to be on a statin given that the mechanism for her myocardial infarction was not due to atherosclerosis.  Lipid profile last year showed an LDL of 81.  4.  Counseling  regarding future pregnancy: The patient is considering getting pregnant.  I discussed with her the risk of recurrence With future pregnancy.  The AHA recommendations advised against pregnancy in female patients who had scad.  However, I was able to find a small registry from [facility] that involved 11 female scad patients that ultimately got pregnant.  Only 2 of them had scad events with pregnancy or shortly after. However, I still favor that the patient does not get pregnant given that her scad involve the left main coronary artery and not a small branch.  In addition, it was associated with cardiomyopathy and her EF did not return to normal. If she decides to get pregnant, I advised her to get in touch with 34 to advise regarding lisinopril which should be discontinued before pregnancy.    Disposition: Follow-up  in 12 months.  Signed,  Lorine Bears, MD  07/27/2022 11:10 AM    [facility]

## 2022-07-27 NOTE — Patient Instructions (Signed)
Medication Instructions:  Your physician recommends that you continue on your current medications as directed. Please refer to the Current Medication list given to you today.  *If you need a refill on your cardiac medications before your next appointment, please call your pharmacy*   Lab Work: Bmp and Cbc today  Please have your labs drawn at the [facility]. Stop at the Registration desk to check in.  If you have labs (blood work) drawn today and your tests are completely normal, you will receive your results only by: MyChart Message (if you have MyChart) OR A paper copy in the mail If you have any lab test that is abnormal or we need to change your treatment, we will call you to review the results.   Testing/Procedures: None ordered   Follow-Up: At [facility], you and your health needs are our priority.  As part of our continuing mission to provide you with exceptional heart care, we have created designated Provider Care Teams.  These Care Teams include your primary Cardiologist (physician) and Advanced Practice Providers (APPs -  Physician Assistants and Nurse Practitioners) who all work together to provide you with the care you need, when you need it.  We recommend signing up for the patient portal called "MyChart".  Sign up information is provided on this After Visit Summary.  MyChart is used to connect with patients for Virtual Visits (Telemedicine).  Patients are able to view lab/test results, encounter notes, upcoming appointments, etc.  Non-urgent messages can be sent to your provider as well.   To learn more about what you can do with MyChart, go to [email or web address].    Your next appointment:   Your physician wants you to follow-up in: 1 year You will receive a reminder letter in the mail two months in advance. If you don't receive a letter, please call our office to schedule the follow-up appointment.   The format for your next appointment:   In  Person  Provider:   You may see Lorine Bears, MD or one of the following Advanced Practice Providers on your designated Care Team:   Nicolasa Ducking, NP Eula Listen, PA-C Cadence Fransico Michael, [place]    Other Instructions N/A  Important Information About Sugar

## 2022-10-30 ENCOUNTER — Telehealth: Payer: Self-pay | Admitting: Cardiovascular Disease

## 2022-10-30 MED ORDER — CARVEDILOL 6.25 MG PO TABS: 6.2500 mg | ORAL_TABLET | Freq: Two times a day (BID) | ORAL | 2 refills | Status: DC

## 2022-10-30 MED ORDER — LISINOPRIL 2.5 MG PO TABS: 2.5000 mg | ORAL_TABLET | Freq: Every day | ORAL | 2 refills | Status: DC

## 2022-10-30 NOTE — Telephone Encounter (Signed)
*  STAT* If patient is at the pharmacy, call can be transferred to refill team.   1. Which medications need to be refilled? (please list name of each medication and dose if known) lisinopril (ZESTRIL) 2.5 MG tablet   carvedilol (COREG) 6.25 MG tablet    2. Which pharmacy/location (including street and city if local pharmacy) is medication to be sent to?  [place], [place]    3. Do they need a 30 day or 90 day supply? [place]

## 2023-07-26 ENCOUNTER — Ambulatory Visit: Payer: BC Managed Care – PPO | Attending: Cardiovascular Disease | Admitting: Cardiovascular Disease

## 2023-07-26 ENCOUNTER — Encounter: Payer: Self-pay | Admitting: Cardiovascular Disease

## 2023-07-26 VITALS — BP 112/70 | HR 64 | Ht 67.0 in | Wt 147.4 lb

## 2023-07-26 DIAGNOSIS — I5022 Chronic systolic (congestive) heart failure: Secondary | ICD-10-CM

## 2023-07-26 DIAGNOSIS — I2510 Atherosclerotic heart disease of native coronary artery without angina pectoris: Secondary | ICD-10-CM

## 2023-07-26 DIAGNOSIS — Z79899 Other long term (current) drug therapy: Secondary | ICD-10-CM | POA: Diagnosis not present

## 2023-07-26 MED ORDER — CARVEDILOL 6.25 MG PO TABS: 6.2500 mg | ORAL_TABLET | Freq: Two times a day (BID) | ORAL | 3 refills | Status: DC

## 2023-07-26 MED ORDER — LISINOPRIL 2.5 MG PO TABS: 2.5000 mg | ORAL_TABLET | Freq: Every day | ORAL | 3 refills | Status: DC

## 2023-07-26 NOTE — Progress Notes (Signed)
Cardiology Office Note   Date:  07/26/2023   ID:  Karina Flynn, DOB 01-05-1981, MRN [id number]  PCP:  Patient, No Pcp Per  Cardiologist:   Lorine Bears, MD   Chief Complaint  Patient presents with   Follow-up    12 month follow up visit. Patient states that swelling in her ankles if she sit for a long periods of time.  Meds reviewed.       History of Present Illness: Karina Flynn is a 42 y.o. 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 She presented in January 2017 with non-ST elevation myocardial infarction. She underwent urgent cardiac catheterization which showed spontaneous coronary artery dissection starting at the distal left main into both LAD and left circumflex. The left circumflex complex was completely occluded. The right coronary artery was normal. Her condition worsened during cardiac catheterization and thus an intra-aortic balloon pump was placed and she was transferred to [facility] where she underwent emergent CABG with LIMA to LAD and SVG to OM. Intraoperative TEE showed an ejection fraction of 40%  Postoperative course was unremarkable except for sinus tachycardia which was well-controlled with carvedilol.   Echocardiogram in June 2022 showed an EF of 45 to 50% with mild mitral regurgitation.  During last visit, we discussed the risk of recurrent scad in the setting of potential pregnancy.  She decided against it after discussing the risks.  She has been doing well with no chest pain or shortness of breath.  She has occasional lower extremity edema that is worse at the end of the day especially if she drives long distance.  She has no edema today.  No orthopnea or PND.   History reviewed. No pertinent past medical history.  Past Surgical History:  Procedure Laterality Date   CARDIAC CATHETERIZATION N/A 01/14/2016   Procedure: Coronary Angiogram;  Surgeon: Iran Ouch, MD;   Location: [facility] INVASIVE CV LAB;  Service: Cardiovascular;  Laterality: N/A;   CARDIAC CATHETERIZATION N/A 01/14/2016   Procedure: IABP Insertion;  Surgeon: Iran Ouch, MD;  Location: [facility] INVASIVE CV LAB;  Service: Cardiovascular;  Laterality: N/A;   CORONARY ARTERY BYPASS GRAFT Left 01/14/2016   Procedure: Coronary artery bypass graft times using right internal mammary artery and left greater saphenous vein via endovein harvest.;  Surgeon: Loreli Slot, MD;  Location: [facility] OR;  Service: Open Heart Surgery;  Laterality: Left;   INNER EAR SURGERY       Current Outpatient Medications  Medication Sig Dispense Refill   aspirin 81 MG EC tablet Take 1 tablet by mouth daily.     carvedilol (COREG) 6.25 MG tablet Take 1 tablet (6.25 mg total) by mouth 2 (two) times daily with a meal. 180 tablet 2   cetirizine (ZYRTEC) 10 MG chewable tablet Chew 10 mg by mouth daily as needed for allergies.     ibuprofen (ADVIL,MOTRIN) 200 MG tablet Take 200 mg by mouth every 6 (six) hours as needed for headache.     lisinopril (ZESTRIL) 2.5 MG tablet Take 1 tablet (2.5 mg total) by mouth daily. 90 tablet 2   Multiple Vitamins-Minerals (HAIR/SKIN/NAILS PO) Take 1 tablet by mouth daily.     No current facility-administered medications for this visit.    Allergies:   Ceclor [cefaclor]    Social History:  The patient  reports that she has never smoked. She has never used smokeless tobacco. She reports that she does not drink  alcohol and does not use drugs.   Family History:  The patient's Family history is unknown by patient.    ROS:  Please see the history of present illness.   Otherwise, review of systems are positive for none.   All other systems are reviewed and negative.    PHYSICAL EXAM: VS:  BP 112/70 (BP Location: Left Arm, Patient Position: Sitting, Cuff Size: Normal)   Pulse 64   Ht 5\' 7"  (1.702 m)   Wt 147 lb 6.4 oz (66.9 kg)   SpO2 99%   BMI 23.09 kg/m  , BMI Body mass index is 23.09  kg/m. GEN: Well nourished, well developed, in no acute distress  HEENT: normal  Neck: no JVD, carotid bruits, or masses Cardiac: RRR; no murmurs, rubs, or gallops,no edema  Respiratory:  clear to auscultation bilaterally, normal work of breathing GI: soft, nontender, nondistended, + BS MS: no deformity or atrophy  Skin: warm and dry, no rash Neuro:  Strength and sensation are intact Psych: euthymic mood, full affect   EKG:  EKG is ordered today. The ekg ordered today demonstrates : Normal sinus rhythm Normal ECG When compared with ECG of 15-Jan-2016 07:39, Criteria for Anterior infarct are no longer Present Criteria for Anterolateral infarct are no longer Present T wave inversion no longer evident in Anterolateral leads QT has shortened     Recent Labs: 07/27/2022: BUN 17; Creatinine, Ser 0.80; Hemoglobin 12.7; Platelets 186; Potassium 4.1; Sodium 138    Lipid Panel    Component Value Date/Time   CHOL 151 06/21/2021 1544   TRIG 84 06/21/2021 1544   HDL 54 06/21/2021 1544   CHOLHDL 2.8 06/21/2021 1544   CHOLHDL 3.0 01/18/2016 0323   VLDL 12 01/18/2016 0323   LDLCALC 81 06/21/2021 1544      Wt Readings from Last 3 Encounters:  07/26/23 147 lb 6.4 oz (66.9 kg)  07/27/22 139 lb 8 oz (63.3 kg)  06/21/21 135 lb 8 oz (61.5 kg)         01/20/2021    3:14 PM  PAD Screen  Previous PAD dx? No  Previous surgical procedure? No  Pain with walking? No  Feet/toe relief with dangling? No  Painful, non-healing ulcers? No  Extremities discolored? No      ASSESSMENT AND PLAN:  1.  Coronary artery disease involving native coronary arteries status post two-vessel CABG in 2017 for spontaneous coronary artery dissection involving left main coronary artery into both the LAD and left circumflex.  She is doing well with no anginal symptoms.  Continue medical therapy.  I refilled her medications.  I requested routine labs.  2.  Chronic systolic heart failure due to ischemic  cardiomyopathy: No evidence of volume overload.  Her ejection fraction today is 45 to 50% on echo.  Based on this, I recommend continuing small dose carvedilol and lisinopril.    3. The patient has no history of hyperlipidemia and does not need to be on a statin given that the mechanism for her myocardial infarction was not due to atherosclerosis.  Lipid profile last year showed an LDL of 81.    Disposition: Follow-up in 12 months.  Signed,  Lorine Bears, MD  07/26/2023 11:16 AM    [facility]

## 2023-07-26 NOTE — Patient Instructions (Signed)
Medication Instructions:  Your Physician recommend you continue on your current medication as directed.    *If you need a refill on your cardiac medications before your next appointment, please call your pharmacy*   Lab Work: Your provider would like for you to have following labs drawn today CMP, CBC and TSH.   If you have labs (blood work) drawn today and your tests are completely normal, you will receive your results only by: MyChart Message (if you have MyChart) OR A paper copy in the mail If you have any lab test that is abnormal or we need to change your treatment, we will call you to review the results.   Testing/Procedures: None ordered today.   Follow-Up: At [facility], you and your health needs are our priority.  As part of our continuing mission to provide you with exceptional heart care, we have created designated Provider Care Teams.  These Care Teams include your primary Cardiologist (physician) and Advanced Practice Providers (APPs -  Physician Assistants and Nurse Practitioners) who all work together to provide you with the care you need, when you need it.  We recommend signing up for the patient portal called "MyChart".  Sign up information is provided on this After Visit Summary.  MyChart is used to connect with patients for Virtual Visits (Telemedicine).  Patients are able to view lab/test results, encounter notes, upcoming appointments, etc.  Non-urgent messages can be sent to your provider as well.   To learn more about what you can do with MyChart, go to [email or web address].    Your next appointment:   12 month(s)  Provider:   You may see Dr. Kirke Corin or one of the following Advanced Practice Providers on your designated Care Team:   Nicolasa Ducking, NP Eula Listen, PA-C Cadence Fransico Michael, PA-C Charlsie Quest, NP

## 2024-08-07 ENCOUNTER — Encounter: Payer: Self-pay | Admitting: Cardiovascular Disease

## 2024-08-07 ENCOUNTER — Ambulatory Visit: Payer: Self-pay | Attending: Cardiovascular Disease | Admitting: Cardiovascular Disease

## 2024-08-07 VITALS — BP 114/60 | HR 69 | Ht 66.0 in | Wt 149.5 lb

## 2024-08-07 DIAGNOSIS — I255 Ischemic cardiomyopathy: Secondary | ICD-10-CM | POA: Diagnosis not present

## 2024-08-07 DIAGNOSIS — I5022 Chronic systolic (congestive) heart failure: Secondary | ICD-10-CM | POA: Diagnosis not present

## 2024-08-07 DIAGNOSIS — I2510 Atherosclerotic heart disease of native coronary artery without angina pectoris: Secondary | ICD-10-CM

## 2024-08-07 NOTE — Progress Notes (Signed)
 Cardiology Office Note   Date:  08/07/2024   ID:  Karina Flynn, DOB 1981/11/27, MRN [id number]  PCP:  Pcp, No  Cardiologist:   Deatrice Cage, MD   Chief Complaint  Patient presents with   Follow-up    12 month f/u c/o chest discomfort(outer chest) believes it's stress related. Meds reviewed verbally with pt.      History of Present Illness: Karina Flynn is a 43 y.o. 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 She presented in January 2017 with non-ST elevation myocardial infarction. She underwent urgent cardiac catheterization which showed spontaneous coronary artery dissection starting at the distal left main into both LAD and left circumflex. The left circumflex complex was completely occluded. The right coronary artery was normal. Her condition worsened during cardiac catheterization and thus an intra-aortic balloon pump was placed and she was transferred to [facility] where she underwent emergent CABG with LIMA to LAD and SVG to OM. Intraoperative TEE showed an ejection fraction of 40%  Postoperative course was unremarkable except for sinus tachycardia which was well-controlled with carvedilol .   Echocardiogram in June 2022 showed an EF of 45 to 50% with mild mitral regurgitation.  She decided against pregnancy due to potential of increased risk of recurrent scad.  She has been doing well overall with no exertional chest pain or shortness of breath.  She did have some episodes of left-sided achiness that was not exertional and did not last for a long time.  She has been under stress due to fall of her mother which resulted in a fracture and complicated by pulmonary embolism.  History reviewed. No pertinent past medical history.  Past Surgical History:  Procedure Laterality Date   CARDIAC CATHETERIZATION N/A 01/14/2016   Procedure: Coronary Angiogram;  Surgeon: Deatrice DELENA Cage, MD;  Location: [facility]  INVASIVE CV LAB;  Service: Cardiovascular;  Laterality: N/A;   CARDIAC CATHETERIZATION N/A 01/14/2016   Procedure: IABP Insertion;  Surgeon: Deatrice DELENA Cage, MD;  Location: [facility] INVASIVE CV LAB;  Service: Cardiovascular;  Laterality: N/A;   CORONARY ARTERY BYPASS GRAFT Left 01/14/2016   Procedure: Coronary artery bypass graft times using right internal mammary artery and left greater saphenous vein via endovein harvest.;  Surgeon: Elspeth JAYSON Millers, MD;  Location: [facility] OR;  Service: Open Heart Surgery;  Laterality: Left;   INNER EAR SURGERY       Current Outpatient Medications  Medication Sig Dispense Refill   aspirin  81 MG EC tablet Take 1 tablet by mouth daily.     carvedilol  (COREG ) 6.25 MG tablet Take 1 tablet (6.25 mg total) by mouth 2 (two) times daily with a meal. 180 tablet 3   cetirizine (ZYRTEC) 10 MG chewable tablet Chew 10 mg by mouth daily as needed for allergies.     ibuprofen (ADVIL,MOTRIN) 200 MG tablet Take 200 mg by mouth every 6 (six) hours as needed for headache.     lisinopril  (ZESTRIL ) 2.5 MG tablet Take 1 tablet (2.5 mg total) by mouth daily. 90 tablet 3   Multiple Vitamins-Minerals (HAIR/SKIN/NAILS PO) Take 1 tablet by mouth daily.     No current facility-administered medications for this visit.    Allergies:   Ceclor [cefaclor]    Social History:  The patient  reports that she has never smoked. She has never used smokeless tobacco. She reports that she does not drink alcohol and does not use drugs.   Family History:  The patient's  Family history is unknown by patient.    ROS:  Please see the history of present illness.   Otherwise, review of systems are positive for none.   All other systems are reviewed and negative.    PHYSICAL EXAM: VS:  BP 114/60 (BP Location: Left Arm, Patient Position: Sitting, Cuff Size: Normal)   Pulse 69   Ht 5' 6 (1.676 m)   Wt 149 lb 8 oz (67.8 kg)   SpO2 98%   BMI 24.13 kg/m  , BMI Body mass index is 24.13 kg/m. GEN: Well  nourished, well developed, in no acute distress  HEENT: normal  Neck: no JVD, carotid bruits, or masses Cardiac: RRR; no murmurs, rubs, or gallops,no edema  Respiratory:  clear to auscultation bilaterally, normal work of breathing GI: soft, nontender, nondistended, + BS MS: no deformity or atrophy  Skin: warm and dry, no rash Neuro:  Strength and sensation are intact Psych: euthymic mood, full affect   EKG:  EKG is ordered today. The ekg ordered today demonstrates : Normal sinus rhythm with sinus arrhythmia Normal ECG When compared with ECG of 26-Jul-2023 11:08, No significant change was found     Recent Labs: No results found for requested labs within last 365 days.    Lipid Panel    Component Value Date/Time   CHOL 151 06/21/2021 1544   TRIG 84 06/21/2021 1544   HDL 54 06/21/2021 1544   CHOLHDL 2.8 06/21/2021 1544   CHOLHDL 3.0 01/18/2016 0323   VLDL 12 01/18/2016 0323   LDLCALC 81 06/21/2021 1544      Wt Readings from Last 3 Encounters:  08/07/24 149 lb 8 oz (67.8 kg)  07/26/23 147 lb 6.4 oz (66.9 kg)  07/27/22 139 lb 8 oz (63.3 kg)         01/20/2021    3:14 PM  PAD Screen  Previous PAD dx? No  Previous surgical procedure? No  Pain with walking? No  Feet/toe relief with dangling? No  Painful, non-healing ulcers? No  Extremities discolored? No      ASSESSMENT AND PLAN:  1.  Coronary artery disease involving native coronary arteries status post two-vessel CABG in 2017 for spontaneous coronary artery dissection involving left main coronary artery into both the LAD and left circumflex.  She had recent atypical chest pain that sounds musculoskeletal.  Her EKG is normal.  No indication for further workup.  2.  Chronic systolic heart failure due to ischemic cardiomyopathy: No evidence of volume overload.  Her ejection fraction today is 45 to 50% on echo.  Continue small dose lisinopril  and carvedilol .  Check labs today.  3. The patient has no history of  hyperlipidemia and does not need to be on a statin given that the mechanism for her myocardial infarction was not due to atherosclerosis.  Most recent lipid profile showed an LDL of 81.   Disposition: Follow-up in 12 months.  Signed,  Deatrice Cage, MD  08/07/2024 11:47 AM    [facility]

## 2024-08-07 NOTE — Patient Instructions (Signed)
 Medication Instructions:  No changes *If you need a refill on your cardiac medications before your next appointment, please call your pharmacy*  Lab Work: Your provider would like for you to have the following labs today: CBC and CMET  If you have labs (blood work) drawn today and your tests are completely normal, you will receive your results only by: MyChart Message (if you have MyChart) OR A paper copy in the mail If you have any lab test that is abnormal or we need to change your treatment, we will call you to review the results.  Testing/Procedures: None ordered  Follow-Up: At [facility], you and your health needs are our priority.  As part of our continuing mission to provide you with exceptional heart care, our providers are all part of one team.  This team includes your primary Cardiologist (physician) and Advanced Practice Providers or APPs (Physician Assistants and Nurse Practitioners) who all work together to provide you with the care you need, when you need it.  Your next appointment:   12 month(s)  Provider:   You may see Dr. Darron or one of the following Advanced Practice Providers on your designated Care Team:   Lonni Meager, NP Lesley Maffucci, PA-C Bernardino Bring, PA-C Cadence Hillcrest, PA-C Tylene Lunch, NP Barnie Hila, NP    We recommend signing up for the patient portal called MyChart.  Sign up information is provided on this After Visit Summary.  MyChart is used to connect with patients for Virtual Visits (Telemedicine).  Patients are able to view lab/test results, encounter notes, upcoming appointments, etc.  Non-urgent messages can be sent to your provider as well.   To learn more about what you can do with MyChart, go to [email or web address].

## 2024-08-08 LAB — CBC
Hematocrit: 40.8 % (ref 34.0–46.6)
Hemoglobin: 13.3 g/dL (ref 11.1–15.9)
MCH: 30.2 pg (ref 26.6–33.0)
MCHC: 32.6 g/dL (ref 31.5–35.7)
MCV: 93 fL (ref 79–97)
Platelets: 186 x10E3/uL (ref 150–450)
RBC: 4.4 x10E6/uL (ref 3.77–5.28)
RDW: 12 % (ref 11.7–15.4)
WBC: 8.4 x10E3/uL (ref 3.4–10.8)

## 2024-08-08 LAB — COMPREHENSIVE METABOLIC PANEL WITH GFR
ALT: 10 IU/L (ref 0–32)
AST: 18 IU/L (ref 0–40)
Albumin: 4.3 g/dL (ref 3.9–4.9)
Alkaline Phosphatase: 82 IU/L (ref 44–121)
BUN/Creatinine Ratio: 18 (ref 9–23)
BUN: 14 mg/dL (ref 6–24)
Bilirubin Total: 0.7 mg/dL (ref 0.0–1.2)
CO2: 21 mmol/L (ref 20–29)
Calcium: 9.6 mg/dL (ref 8.7–10.2)
Chloride: 101 mmol/L (ref 96–106)
Creatinine, Ser: 0.78 mg/dL (ref 0.57–1.00)
Globulin, Total: 2.3 g/dL (ref 1.5–4.5)
Glucose: 74 mg/dL (ref 70–99)
Potassium: 5.1 mmol/L (ref 3.5–5.2)
Sodium: 137 mmol/L (ref 134–144)
Total Protein: 6.6 g/dL (ref 6.0–8.5)
eGFR: 97 mL/min/1.73 (ref >59)

## 2024-08-11 ENCOUNTER — Ambulatory Visit: Payer: Self-pay | Admitting: Cardiovascular Disease

## 2024-08-18 ENCOUNTER — Telehealth: Payer: Self-pay | Admitting: Cardiovascular Disease

## 2024-08-18 MED ORDER — CARVEDILOL 6.25 MG PO TABS: 6.2500 mg | ORAL_TABLET | Freq: Two times a day (BID) | ORAL | 3 refills | Status: AC

## 2024-08-18 MED ORDER — LISINOPRIL 2.5 MG PO TABS: 2.5000 mg | ORAL_TABLET | Freq: Every day | ORAL | 3 refills | Status: AC

## 2024-08-18 NOTE — Telephone Encounter (Signed)
 RX sent in

## 2024-08-18 NOTE — Telephone Encounter (Signed)
*  STAT* If patient is at the pharmacy, call can be transferred to refill team.   1. Which medications need to be refilled? (please list name of each medication and dose if known)   carvedilol  (COREG ) 6.25 MG tablet  lisinopril  (ZESTRIL ) 2.5 MG tablet   2. Would you like to learn more about the convenience, safety, & potential cost savings by using the [facility]?   3. Are you open to using the [facility] (Type [facility]. ).  4. Which pharmacy/location (including street and city if local pharmacy) is medication to be sent to?  [facility] - [place], [place] - [address]   5. Do they need a 30 day or 90 day supply?   90 day  Caller Lynden) stated patient may be completely out of this medication.
